# Patient Record
Sex: Female | Born: 1965
Health system: Southern US, Community
[De-identification: ages and names within clinical notes are randomized; demographics above are authoritative.]

## PROBLEM LIST (undated history)

## (undated) DIAGNOSIS — D649 Anemia, unspecified: Secondary | ICD-10-CM

## (undated) DIAGNOSIS — E229 Hyperfunction of pituitary gland, unspecified: Secondary | ICD-10-CM

## (undated) DIAGNOSIS — D352 Benign neoplasm of pituitary gland: Secondary | ICD-10-CM

## (undated) DIAGNOSIS — R7989 Other specified abnormal findings of blood chemistry: Secondary | ICD-10-CM

## (undated) HISTORY — DX: Anemia, unspecified: D64.9

## (undated) HISTORY — DX: Hyperfunction of pituitary gland, unspecified: E22.9

## (undated) HISTORY — PX: LASER ABLATION OF THE CERVIX: SHX1949

## (undated) HISTORY — DX: Benign neoplasm of pituitary gland: D35.2

## (undated) HISTORY — DX: Other specified abnormal findings of blood chemistry: R79.89

## (undated) HISTORY — PX: COLPOSCOPY: SHX161

---

## 1998-11-28 ENCOUNTER — Other Ambulatory Visit: Admission: RE | Admit: 1998-11-28 | Discharge: 1998-11-28 | Payer: Self-pay | Admitting: Gynecology

## 1999-12-02 ENCOUNTER — Other Ambulatory Visit: Admission: RE | Admit: 1999-12-02 | Discharge: 1999-12-02 | Payer: Self-pay | Admitting: Gynecology

## 2001-02-16 ENCOUNTER — Other Ambulatory Visit: Admission: RE | Admit: 2001-02-16 | Discharge: 2001-02-16 | Payer: Self-pay | Admitting: Gynecology

## 2002-03-14 ENCOUNTER — Other Ambulatory Visit: Admission: RE | Admit: 2002-03-14 | Discharge: 2002-03-14 | Payer: Self-pay | Admitting: Gynecology

## 2003-03-27 ENCOUNTER — Other Ambulatory Visit: Admission: RE | Admit: 2003-03-27 | Discharge: 2003-03-27 | Payer: Self-pay | Admitting: Gynecology

## 2004-07-01 ENCOUNTER — Other Ambulatory Visit: Admission: RE | Admit: 2004-07-01 | Discharge: 2004-07-01 | Payer: Self-pay | Admitting: Gynecology

## 2005-08-05 ENCOUNTER — Other Ambulatory Visit: Admission: RE | Admit: 2005-08-05 | Discharge: 2005-08-05 | Payer: Self-pay | Admitting: Gynecology

## 2006-08-10 ENCOUNTER — Other Ambulatory Visit: Admission: RE | Admit: 2006-08-10 | Discharge: 2006-08-10 | Payer: Self-pay | Admitting: Gynecology

## 2007-01-10 ENCOUNTER — Encounter: Admission: RE | Admit: 2007-01-10 | Discharge: 2007-01-10 | Payer: Self-pay | Admitting: Gynecology

## 2008-02-03 ENCOUNTER — Encounter: Admission: RE | Admit: 2008-02-03 | Discharge: 2008-02-03 | Payer: Self-pay | Admitting: Gynecology

## 2008-05-17 ENCOUNTER — Ambulatory Visit: Payer: Self-pay | Admitting: Women's Health

## 2008-05-17 ENCOUNTER — Other Ambulatory Visit: Admission: RE | Admit: 2008-05-17 | Discharge: 2008-05-17 | Payer: Self-pay | Admitting: Gynecology

## 2008-05-17 ENCOUNTER — Encounter: Payer: Self-pay | Admitting: Women's Health

## 2009-03-19 ENCOUNTER — Encounter: Admission: RE | Admit: 2009-03-19 | Discharge: 2009-03-19 | Payer: Self-pay | Admitting: Gynecology

## 2009-07-02 ENCOUNTER — Ambulatory Visit: Payer: Self-pay | Admitting: Gynecology

## 2009-07-03 ENCOUNTER — Ambulatory Visit: Payer: Self-pay | Admitting: Gynecology

## 2009-07-25 ENCOUNTER — Encounter: Admission: RE | Admit: 2009-07-25 | Discharge: 2009-07-25 | Payer: Self-pay | Admitting: Gastroenterology

## 2009-09-20 ENCOUNTER — Ambulatory Visit: Payer: Self-pay | Admitting: Gynecology

## 2010-02-05 ENCOUNTER — Other Ambulatory Visit: Admission: RE | Admit: 2010-02-05 | Discharge: 2010-02-05 | Payer: Self-pay | Admitting: Gynecology

## 2010-02-05 ENCOUNTER — Ambulatory Visit: Payer: Self-pay | Admitting: Women's Health

## 2010-02-17 ENCOUNTER — Ambulatory Visit: Payer: Self-pay | Admitting: Women's Health

## 2010-02-17 ENCOUNTER — Ambulatory Visit: Payer: Self-pay | Admitting: Gynecology

## 2010-02-24 ENCOUNTER — Ambulatory Visit: Payer: Self-pay | Admitting: Women's Health

## 2010-02-24 HISTORY — PX: ENDOMETRIAL ABLATION: SHX621

## 2010-03-12 ENCOUNTER — Ambulatory Visit (HOSPITAL_COMMUNITY): Admission: RE | Admit: 2010-03-12 | Discharge: 2010-03-12 | Payer: Self-pay | Admitting: Gynecology

## 2010-03-19 ENCOUNTER — Ambulatory Visit: Payer: Self-pay | Admitting: Gynecology

## 2010-03-24 ENCOUNTER — Ambulatory Visit: Payer: Self-pay | Admitting: Gynecology

## 2010-03-27 ENCOUNTER — Encounter: Admission: RE | Admit: 2010-03-27 | Discharge: 2010-03-27 | Payer: Self-pay | Admitting: Gynecology

## 2010-03-27 DIAGNOSIS — D352 Benign neoplasm of pituitary gland: Secondary | ICD-10-CM

## 2010-03-27 HISTORY — DX: Benign neoplasm of pituitary gland: D35.2

## 2010-04-07 ENCOUNTER — Ambulatory Visit: Payer: Self-pay | Admitting: Gynecology

## 2010-05-12 ENCOUNTER — Ambulatory Visit (HOSPITAL_COMMUNITY): Admission: RE | Admit: 2010-05-12 | Discharge: 2010-05-12 | Payer: Self-pay | Admitting: Gynecology

## 2010-05-26 ENCOUNTER — Encounter: Admission: RE | Admit: 2010-05-26 | Discharge: 2010-05-26 | Payer: Self-pay | Admitting: Gynecology

## 2010-08-16 ENCOUNTER — Encounter: Payer: Self-pay | Admitting: Gynecology

## 2010-08-18 ENCOUNTER — Encounter: Payer: Self-pay | Admitting: Gynecology

## 2010-09-12 ENCOUNTER — Emergency Department (HOSPITAL_BASED_OUTPATIENT_CLINIC_OR_DEPARTMENT_OTHER)
Admission: EM | Admit: 2010-09-12 | Discharge: 2010-09-12 | Disposition: A | Discharge status: Home / Self Care | Payer: 59 | Attending: Emergency Medicine | Admitting: Emergency Medicine

## 2010-09-12 ENCOUNTER — Emergency Department (INDEPENDENT_AMBULATORY_CARE_PROVIDER_SITE_OTHER): Payer: 59

## 2010-09-12 DIAGNOSIS — Y92009 Unspecified place in unspecified non-institutional (private) residence as the place of occurrence of the external cause: Secondary | ICD-10-CM | POA: Insufficient documentation

## 2010-09-12 DIAGNOSIS — S92919A Unspecified fracture of unspecified toe(s), initial encounter for closed fracture: Secondary | ICD-10-CM | POA: Insufficient documentation

## 2010-09-12 DIAGNOSIS — E109 Type 1 diabetes mellitus without complications: Secondary | ICD-10-CM | POA: Insufficient documentation

## 2010-09-12 DIAGNOSIS — S90129A Contusion of unspecified lesser toe(s) without damage to nail, initial encounter: Secondary | ICD-10-CM

## 2010-09-12 DIAGNOSIS — W208XXA Other cause of strike by thrown, projected or falling object, initial encounter: Secondary | ICD-10-CM | POA: Insufficient documentation

## 2010-10-10 LAB — CREATININE, SERUM
Creatinine, Ser: 0.71 mg/dL (ref 0.4–1.2)
GFR calc non Af Amer: >60 mL/min (ref >60)

## 2010-12-01 ENCOUNTER — Ambulatory Visit (INDEPENDENT_AMBULATORY_CARE_PROVIDER_SITE_OTHER): Payer: 59 | Admitting: Gynecology

## 2010-12-02 ENCOUNTER — Other Ambulatory Visit: Payer: Self-pay | Admitting: Gynecology

## 2010-12-02 DIAGNOSIS — N6320 Unspecified lump in the left breast, unspecified quadrant: Secondary | ICD-10-CM

## 2010-12-04 ENCOUNTER — Ambulatory Visit
Admission: RE | Admit: 2010-12-04 | Discharge: 2010-12-04 | Disposition: A | Discharge status: Home / Self Care | Payer: 59 | Source: Ambulatory Visit | Attending: Gynecology | Admitting: Gynecology

## 2010-12-04 ENCOUNTER — Other Ambulatory Visit: Payer: Self-pay | Admitting: Gynecology

## 2010-12-04 DIAGNOSIS — N6320 Unspecified lump in the left breast, unspecified quadrant: Secondary | ICD-10-CM

## 2010-12-09 ENCOUNTER — Ambulatory Visit: Payer: Self-pay | Admitting: Family Medicine

## 2010-12-29 ENCOUNTER — Ambulatory Visit: Payer: Self-pay | Admitting: Family Medicine

## 2011-01-26 ENCOUNTER — Encounter: Payer: Self-pay | Admitting: Family Medicine

## 2011-01-26 ENCOUNTER — Ambulatory Visit (INDEPENDENT_AMBULATORY_CARE_PROVIDER_SITE_OTHER): Payer: 59 | Admitting: Family Medicine

## 2011-01-26 DIAGNOSIS — M7710 Lateral epicondylitis, unspecified elbow: Secondary | ICD-10-CM

## 2011-01-26 DIAGNOSIS — E109 Type 1 diabetes mellitus without complications: Secondary | ICD-10-CM

## 2011-01-26 DIAGNOSIS — N6019 Diffuse cystic mastopathy of unspecified breast: Secondary | ICD-10-CM

## 2011-01-26 DIAGNOSIS — Z0000 Encounter for general adult medical examination without abnormal findings: Secondary | ICD-10-CM | POA: Insufficient documentation

## 2011-01-26 MED ORDER — INSULIN LISPRO 100 UNIT/ML ~~LOC~~ SOLN: SUBCUTANEOUS | Status: DC

## 2011-01-26 MED ORDER — INSULIN SYRINGE-NEEDLE U-100 30G X 1/2" 0.5 ML MISC: Status: DC

## 2011-01-26 MED ORDER — INSULIN GLARGINE 100 UNIT/ML ~~LOC~~ SOLN: 10.0000 [IU] | Freq: Two times a day (BID) | SUBCUTANEOUS | Status: DC

## 2011-01-26 MED ORDER — "INSULIN SYRINGE-NEEDLE U-100 30G X 1/2"" 0.5 ML MISC": Status: DC

## 2011-01-26 MED ORDER — GLUCOSE BLOOD VI STRP: ORAL_STRIP | Status: DC

## 2011-01-26 NOTE — Progress Notes (Signed)
  Subjective:    Patient ID: Maria White, female    DOB: May 02, 1966, 45 y.o.   MRN: [id number]  HPI New to establish.  Previous MD- Dagoberto Ligas.  GYN- Fontaine.  DM- type I, needs new Endo.  On insulin.  Labs last checked 6-7 months ago.  Has never had elevated cholesterol.  UTD on eye exams.  BP excellent.  No symptomatic lows, no CP, SOB, HAs, visual changes, N/V.  Fibrocystic breasts- has had multiple mammogram call backs that required [place].  Follows regularly.  R tennis elbow- has seen Dr Samuel Bouche at [facility] ortho and is currently in PT.  On [place] prn.  Reports sxs are getting better.  sxs first started 3 months ago after doing heavy lifting.   Review of Systems For ROS see HPI     Objective:   Physical Exam  Constitutional: She is oriented to person, place, and time. She appears well-developed and well-nourished. No distress.  HENT:  Head: Normocephalic and atraumatic.  Eyes: Conjunctivae and EOM are normal. Pupils are equal, round, and reactive to light.  Neck: Normal range of motion. Neck supple. No thyromegaly present.  Cardiovascular: Normal rate, regular rhythm, normal heart sounds and intact distal pulses.   No murmur heard. Pulmonary/Chest: Effort normal and breath sounds normal. No respiratory distress.  Abdominal: Soft. She exhibits no distension. There is no tenderness.  Musculoskeletal: Normal range of motion. She exhibits tenderness (minimal tenderness over R lateral epicondyle). She exhibits no edema.  Lymphadenopathy:    She has no cervical adenopathy.  Neurological: She is alert and oriented to person, place, and time.  Skin: Skin is warm and dry.  Psychiatric: She has a normal mood and affect. Her behavior is normal.          Assessment & Plan:

## 2011-01-26 NOTE — Patient Instructions (Signed)
Please schedule your complete physical at your convenience- get an 8 am appt but arrive ~7:45 for labs [facility] call you with your Endo appt Call with any questions or concerns Welcome!  We're glad to have you!

## 2011-02-03 DIAGNOSIS — M7710 Lateral epicondylitis, unspecified elbow: Secondary | ICD-10-CM | POA: Insufficient documentation

## 2011-02-03 DIAGNOSIS — N6019 Diffuse cystic mastopathy of unspecified breast: Secondary | ICD-10-CM | POA: Insufficient documentation

## 2011-02-03 NOTE — Assessment & Plan Note (Signed)
Labs ordered for upcoming CPE- pt to have labs prior to 8am appt due to insulin dependent DM

## 2011-02-03 NOTE — Assessment & Plan Note (Addendum)
Refills provided on pt's meds.  Referral to endo made.

## 2011-02-03 NOTE — Assessment & Plan Note (Signed)
Pt UTD on mammos.  Will follow along w/ clinical exams.

## 2011-02-03 NOTE — Assessment & Plan Note (Signed)
Following w/ ortho.  Currently in PT.  Feels sxs are improving.  Will follow.

## 2011-02-17 ENCOUNTER — Telehealth: Payer: Self-pay | Admitting: *Deleted

## 2011-02-17 MED ORDER — GLUCOSE BLOOD VI STRP: ORAL_STRIP | Status: AC

## 2011-02-17 NOTE — Telephone Encounter (Signed)
Pt aware Rx call in to pharmacy.

## 2011-02-17 NOTE — Telephone Encounter (Signed)
If she is testing that often she can have an increased # of strips.  She is also seeing Endo for her DM.

## 2011-02-17 NOTE — Telephone Encounter (Signed)
Pt states that she test her BS from 10 to 15 times a day so she needs at least 7 boxes to cover a 3 month supply. Please advise

## 2011-02-27 ENCOUNTER — Other Ambulatory Visit: Payer: Self-pay | Admitting: Internal Medicine

## 2011-02-27 DIAGNOSIS — D353 Benign neoplasm of craniopharyngeal duct: Secondary | ICD-10-CM

## 2011-03-06 ENCOUNTER — Telehealth: Payer: Self-pay | Admitting: Family Medicine

## 2011-03-06 NOTE — Telephone Encounter (Signed)
Pt husband aware

## 2011-03-06 NOTE — Telephone Encounter (Signed)
yes

## 2011-03-19 ENCOUNTER — Encounter: Payer: Self-pay | Admitting: Family Medicine

## 2011-03-31 ENCOUNTER — Other Ambulatory Visit: Payer: 59

## 2011-04-02 ENCOUNTER — Encounter: Payer: Self-pay | Admitting: Family Medicine

## 2011-04-02 ENCOUNTER — Ambulatory Visit (INDEPENDENT_AMBULATORY_CARE_PROVIDER_SITE_OTHER): Payer: 59 | Admitting: Family Medicine

## 2011-04-02 VITALS — BP 100/70 | HR 80 | Temp 99.5°F | Wt 130.4 lb

## 2011-04-02 DIAGNOSIS — J029 Acute pharyngitis, unspecified: Secondary | ICD-10-CM

## 2011-04-02 NOTE — Patient Instructions (Signed)
Common Cold, Adult An upper respiratory tract infection, or cold, is a viral infection of the air passages to the lung. Colds are contagious, especially during the first 3 or 4 days. Antibiotics cannot cure a cold. Cold germs are spread by coughs, sneezes, and hand to hand contact. A respiratory tract infection usually clears up in a few days, but some people may be sick for a week or two. HOME CARE INSTRUCTIONS  Only take over-the-counter or prescription medicines for pain, discomfort, or fever as directed by your caregiver.   Be careful not to blow your nose too hard. This may cause a nosebleed.   Use a cool-mist humidifier (vaporizer) to increase air moisture. This will make it easier for you to breath. Do not use hot steam.   Rest as much as possible and get plenty of sleep.   Wash your hands often, especially after you blow your nose. Cover your mouth and nose with a tissue when you sneeze or cough.   Drink at least 8 glasses of clear liquids every day, such as water, fruit juices, tea, clear soups, and carbonated beverages.  SEEK MEDICAL CARE IF:  An oral temperature above 100.4 lasts 4 days or more, and is not controlled by medication.   You have a sore throat that gets worse or you see white or yellow spots in your throat.   Your cough gets worse or lasts more than 10 days.   You have a rash somewhere on your skin. You have large and tender lumps in your neck.   You have an earache or a headache.   You have thick, greenish or yellowish discharge from your nose.   You cough-up thick yellow, green, gray or bloody mucus (secretions).  SEEK IMMEDIATE MEDICAL CARE IF: You have trouble breathing, chest pain, or your skin or nails look gray or blue. MAKE SURE YOU:   Understand these instructions.   Will watch your condition.   Will get help right away if you are not doing well or get worse.  Document Released: 07/10/2000 Document Re-Released: 06/25/2008 [employer] Patient  Information 2011 [employer]. 

## 2011-04-02 NOTE — Progress Notes (Signed)
  Subjective:     Maria White is a 45 y.o. female who presents for evaluation of sore throat. Associated symptoms include post nasal drip, sinus and nasal congestion, sore throat and swollen glands. Onset of symptoms was 6 days ago, and have been gradually worsening since that time. She is drinking plenty of fluids. She has not had a recent close exposure to someone with proven streptococcal pharyngitis.  The following portions of the patient's history were reviewed and updated as appropriate: allergies, current medications, past family history, past medical history, past social history, past surgical history and problem list.  Review of Systems Pertinent items are noted in HPI.    Objective:    BP 100/70  Pulse 80  Temp(Src) 99.5 F (37.5 C) (Oral)  Wt 130 lb 6.4 oz (59.149 kg)  SpO2 97% General appearance: alert, cooperative, appears stated age and no distress Head: Normocephalic, without obvious abnormality, atraumatic Ears: normal TM's and external ear canals both ears Nose: Nares normal. Septum midline. Mucosa normal. No drainage or sinus tenderness. Throat: abnormal findings: mild oropharyngeal erythema and pnd Neck: no adenopathy, no carotid bruit, no JVD, supple, symmetrical, trachea midline and thyroid not enlarged, symmetric, no tenderness/mass/nodules Lungs: clear to auscultation bilaterally Heart: S1, S2 normal Skin: Skin color, texture, turgor normal. No rashes or lesions  Laboratory Strep test not done. Results:n/a.    Assessment:    Acute pharyngitis, likely  Viral pharyngitis.    Plan:    Use of OTC analgesics recommended as well as salt water gargles. Follow up as needed. nasonex, otc antihistamine

## 2011-04-03 LAB — CBC WITH DIFFERENTIAL/PLATELET
Eosinophils Absolute: 0.1 10*3/uL (ref 0.0–0.7)
Eosinophils Relative: 1.3 % (ref 0.0–5.0)
HCT: 37.1 % (ref 36.0–46.0)
Lymphs Abs: 1.8 10*3/uL (ref 0.7–4.0)
MCHC: 33.4 g/dL (ref 30.0–36.0)
MCV: 88.1 fl (ref 78.0–100.0)
Monocytes Absolute: 0.5 10*3/uL (ref 0.1–1.0)
Platelets: 184 10*3/uL (ref 150.0–400.0)
WBC: 4.2 10*3/uL — ABNORMAL LOW (ref 4.5–10.5)

## 2011-04-03 LAB — MONONUCLEOSIS SCREEN: Mono Screen: NEGATIVE

## 2011-04-08 ENCOUNTER — Encounter: Payer: 59 | Admitting: Women's Health

## 2011-04-15 ENCOUNTER — Encounter: Payer: Self-pay | Admitting: Women's Health

## 2011-04-15 ENCOUNTER — Other Ambulatory Visit (HOSPITAL_COMMUNITY)
Admission: RE | Admit: 2011-04-15 | Discharge: 2011-04-15 | Disposition: A | Discharge status: Home / Self Care | Payer: 59 | Source: Ambulatory Visit | Attending: Gynecology | Admitting: Gynecology

## 2011-04-15 ENCOUNTER — Ambulatory Visit (INDEPENDENT_AMBULATORY_CARE_PROVIDER_SITE_OTHER): Payer: 59 | Admitting: Women's Health

## 2011-04-15 VITALS — BP 116/74 | Ht 64.5 in | Wt 130.0 lb

## 2011-04-15 DIAGNOSIS — Z01419 Encounter for gynecological examination (general) (routine) without abnormal findings: Secondary | ICD-10-CM | POA: Insufficient documentation

## 2011-04-15 NOTE — Progress Notes (Signed)
Maria White 1966/03/05 [id number]    History:    The patient presents for annual exam.  Type I diabetic on insulin, stay-at-home mom, has 2 daughters Reuel Boom. Encouraged Gardasil for both daughters.   Past medical history, past surgical history, family history and social history were all reviewed and documented in the EPIC chart.   ROS:  A  ROS was performed and pertinent positives and negatives are included in the history.  Exam:  Filed Vitals:   04/15/11 0935  BP: 116/74    General appearance:  Normal Head/Neck:  Normal, without cervical or supraclavicular adenopathy. Thyroid:  Symmetrical, normal in size, without palpable masses or nodularity. Respiratory  Effort:  Normal  Auscultation:  Clear without wheezing or rhonchi Cardiovascular  Auscultation:  Regular rate, without rubs, murmurs or gallops  Edema/varicosities:  Not grossly evident Abdominal  Soft,nontender, without masses, guarding or rebound.  Liver/spleen:  No organomegaly noted  Hernia:  None appreciated  Skin  Inspection:  Grossly normal  Palpation:  Grossly normal Neurologic/psychiatric  Orientation:  Normal with appropriate conversation.  Mood/affect:  Normal  Genitourinary    Breasts: Examined lying and sitting.     Right: Without masses, retractions, discharge or axillary adenopathy.     Left: Without masses, retractions, discharge or axillary adenopathy.   Inguinal/mons:  Normal without inguinal adenopathy  External genitalia:  Normal  BUS/Urethra/Skene's glands:  Normal  Bladder:  Normal  Vagina:  Normal  Cervix:  Normal  Uterus:  retroverted, normal in size, shape and contour.  Midline and mobile  Adnexa/parametria:     Rt: Without masses or tenderness.   Lt: Without masses or tenderness.  Anus and perineum: Normal  Digital rectal exam: Normal sphincter tone without palpated masses or tenderness  Assessment/Plan:  44 y.o.MWF G3P2  for annual exam without complaint. Monthly 4-8  days light cycle/vasectomy . Her option August of 2011, has helped her cycles to become light. History of a 5 mm pituitary adenoma September 2011. Had a prolactin of 25 at her primary care last month, did review importance of annual levels, and MRI every few years to check for stability if continues symptom free.  Normal GYN exam  Plan: Continue SBEs, yearly mammogram, calcium rich diet, exercise. Type I diabetic on insulin since age 89 needs referral to Dr. Talmage Nap will get that done, Dr Dagoberto Ligas retired. Labs are done at primary care. Continue annual flu vaccine, Tdap vaccine reviewed, has had a pneumonia vaccine.   Harrington Challenger [facility], 10:11 AM 04/15/2011

## 2011-05-05 ENCOUNTER — Telehealth: Payer: Self-pay | Admitting: *Deleted

## 2011-05-05 NOTE — Telephone Encounter (Signed)
Message     Needs referral to Dr Talmage Nap endocrinologist, Type 1 diabetic, Dr Dagoberto Ligas in past           Called patient about referral to Dr. Talmage Nap.  Records had been sent.  But patient had recently seen endocrinologist so she could not be seen by Dr. Talmage Nap just yet.  She would have to wait about 6 months per insurance plan.  She said they will hold her records and call her when time to schedule.

## 2011-05-16 ENCOUNTER — Other Ambulatory Visit: Payer: Self-pay | Admitting: Family Medicine

## 2011-05-18 ENCOUNTER — Other Ambulatory Visit: Payer: Self-pay | Admitting: *Deleted

## 2011-05-18 MED ORDER — INSULIN GLARGINE 100 UNIT/ML ~~LOC~~ SOLN: 10.0000 [IU] | Freq: Two times a day (BID) | SUBCUTANEOUS | Status: DC

## 2011-05-27 ENCOUNTER — Other Ambulatory Visit: Payer: Self-pay | Admitting: Gynecology

## 2011-05-27 ENCOUNTER — Other Ambulatory Visit: Payer: Self-pay | Admitting: Obstetrics and Gynecology

## 2011-05-27 DIAGNOSIS — Z1231 Encounter for screening mammogram for malignant neoplasm of breast: Secondary | ICD-10-CM

## 2011-06-04 ENCOUNTER — Ambulatory Visit
Admission: RE | Admit: 2011-06-04 | Discharge: 2011-06-04 | Disposition: A | Discharge status: Home / Self Care | Payer: 59 | Source: Ambulatory Visit | Attending: Gynecology | Admitting: Gynecology

## 2011-06-04 DIAGNOSIS — Z1231 Encounter for screening mammogram for malignant neoplasm of breast: Secondary | ICD-10-CM

## 2011-10-08 ENCOUNTER — Telehealth: Payer: Self-pay | Admitting: *Deleted

## 2011-10-08 NOTE — Telephone Encounter (Signed)
Pt called requesting lab faxed to dr. Talmage Nap office, office notes sent per request.

## 2011-10-26 ENCOUNTER — Other Ambulatory Visit: Payer: Self-pay | Admitting: Family Medicine

## 2011-10-26 NOTE — Telephone Encounter (Signed)
Patient states she needs a prescription sent to [facility] on [address] [place] for  Humalog She is currently waiting for new insurance to kick in & can only afford 1-vile at this time. Patient ph# [phone]

## 2011-10-27 ENCOUNTER — Telehealth: Payer: Self-pay | Admitting: *Deleted

## 2011-10-27 MED ORDER — INSULIN LISPRO 100 UNIT/ML ~~LOC~~ SOLN: SUBCUTANEOUS | Status: DC

## 2011-10-27 NOTE — Telephone Encounter (Signed)
Pt called requesting refill on Humalog insulin, pt informed nancy is off today and she did not  write Rx so pt will need to get rx from PCP.

## 2011-10-27 NOTE — Telephone Encounter (Signed)
rx sent to pharmacy by e-script for one vial Letter has been mailed to pt address noted in the chart to advise they are overdue for cpe/ov/labs and the pt needs to contact office to set up appt

## 2011-10-27 NOTE — Telephone Encounter (Signed)
Patient called again regarding her insulin. She states she is almost out and would like 1 vial called in ASAP.

## 2011-12-29 DIAGNOSIS — E119 Type 2 diabetes mellitus without complications: Secondary | ICD-10-CM | POA: Insufficient documentation

## 2012-06-20 ENCOUNTER — Other Ambulatory Visit: Payer: Self-pay | Admitting: Women's Health

## 2012-06-20 DIAGNOSIS — Z1231 Encounter for screening mammogram for malignant neoplasm of breast: Secondary | ICD-10-CM

## 2012-08-04 ENCOUNTER — Ambulatory Visit
Admission: RE | Admit: 2012-08-04 | Discharge: 2012-08-04 | Disposition: A | Discharge status: Home / Self Care | Payer: 59 | Source: Ambulatory Visit | Attending: Women's Health | Admitting: Women's Health

## 2012-08-04 DIAGNOSIS — Z1231 Encounter for screening mammogram for malignant neoplasm of breast: Secondary | ICD-10-CM

## 2013-04-26 ENCOUNTER — Other Ambulatory Visit (HOSPITAL_COMMUNITY)
Admission: RE | Admit: 2013-04-26 | Discharge: 2013-04-26 | Disposition: A | Discharge status: Home / Self Care | Payer: 59 | Source: Ambulatory Visit | Attending: Gynecology | Admitting: Gynecology

## 2013-04-26 ENCOUNTER — Ambulatory Visit (INDEPENDENT_AMBULATORY_CARE_PROVIDER_SITE_OTHER): Payer: 59 | Admitting: Women's Health

## 2013-04-26 ENCOUNTER — Encounter: Payer: Self-pay | Admitting: Women's Health

## 2013-04-26 VITALS — BP 110/70 | Ht 64.0 in | Wt 136.0 lb

## 2013-04-26 DIAGNOSIS — Z01419 Encounter for gynecological examination (general) (routine) without abnormal findings: Secondary | ICD-10-CM | POA: Insufficient documentation

## 2013-04-26 DIAGNOSIS — D352 Benign neoplasm of pituitary gland: Secondary | ICD-10-CM

## 2013-04-26 LAB — URINALYSIS W MICROSCOPIC + REFLEX CULTURE
Casts: NONE SEEN
Crystals: NONE SEEN
Glucose, UA: 100 mg/dL — AB
Leukocytes, UA: NEGATIVE
Squamous Epithelial / LPF: NONE SEEN
pH: 6 (ref 5.0–8.0)

## 2013-04-26 NOTE — Patient Instructions (Addendum)

## 2013-04-26 NOTE — Progress Notes (Addendum)
Maria White [birth year] [id number]    History:    The patient presents for annual exam.  Light monthly cycle/vasectomy. Her option 2011 with good relief of menorrhagia. History of laser treatment with normal Paps after. Type I diabetic diagnosed at age 47. Dr. Talmage Nap manages. Normal mammograms. History of pituitary adenoma 2011 with normal prolactins after. Has difficulty with MRIs due to diabetes and needs to sedated.   Past medical history, past surgical history, family history and social history were all reviewed and documented in the EPIC chart. Stay-at-home mom. Irving Burton 17 at [facility], Trinna Post 14 doing well. Maternal grandmother, maternal great-grandmother breast cancer. Mother hypertension.   ROS:  A  ROS was performed and pertinent positives and negatives are included in the history.  Exam:  Filed Vitals:   04/26/13 0954  BP: 110/70    General appearance:  Normal Head/Neck:  Normal, without cervical or supraclavicular adenopathy. Thyroid:  Symmetrical, normal in size, without palpable masses or nodularity. Respiratory  Effort:  Normal  Auscultation:  Clear without wheezing or rhonchi Cardiovascular  Auscultation:  Regular rate, without rubs, murmurs or gallops  Edema/varicosities:  Not grossly evident Abdominal  Soft,nontender, without masses, guarding or rebound.  Liver/spleen:  No organomegaly noted  Hernia:  None appreciated  Skin  Inspection:  Grossly normal  Palpation:  Grossly normal Neurologic/psychiatric  Orientation:  Normal with appropriate conversation.  Mood/affect:  Normal  Genitourinary    Breasts: Examined lying and sitting.     Right: Without masses, retractions, discharge or axillary adenopathy.     Left: Without masses, retractions, discharge or axillary adenopathy.   Inguinal/mons:  Normal without inguinal adenopathy  External genitalia:  Normal  BUS/Urethra/Skene's glands:  Normal  Bladder:  Normal  Vagina:  Normal  Cervix:  Normal  Uterus:    normal in size, shape and contour.  Midline and mobile  Adnexa/parametria:     Rt: Without masses or tenderness.   Lt: Without masses or tenderness.  Anus and perineum: Normal  Digital rectal exam: Normal sphincter tone without palpated masses or tenderness  Assessment/Plan:  47 y.o. M. WF G2 P2 for annual exam with no complaints.  Type 1 diabetes  Her option 2011 good relief of menorrhagia/light cycles/vasectomy Laser treatment of cervix 1994 with normal Paps after History of pituitary adenoma 2011  Plan: Prolactin, UA, Pap. Pap normal 2012, new screening guidelines reviewed. SBE's, continue annual mammogram, 3G tomography reviewed and encouraged history of dense breast. Continue regular exercise and good self-care, followup with Dr. Talmage Nap for labs. Continue annual prolactins.    Harrington Challenger WHNP, 11:05 AM 04/26/2013

## 2013-05-24 ENCOUNTER — Telehealth: Payer: Self-pay | Admitting: Women's Health

## 2013-05-24 NOTE — Telephone Encounter (Signed)
Telephone call to review prolactin. Prolactin low. History of an elevated prolactin with a MRI 2011 that was suspicious for 5 mm microadenoma. Had been on Dostinex in the past for elevated prolactins. Type I diabetic on insulin. Difficult to be n.p.o. for MRI and also needs to be sedated for MRI. After review with Dr. Audie Box will continue annual prolactin, reviewed importance of coming annually for physical and prolactin checked. The prolactin elevated will repeat MRI done.

## 2013-05-29 ENCOUNTER — Encounter: Payer: Self-pay | Admitting: Gynecology

## 2013-07-05 ENCOUNTER — Ambulatory Visit (INDEPENDENT_AMBULATORY_CARE_PROVIDER_SITE_OTHER): Payer: 59 | Admitting: Family Medicine

## 2013-07-05 ENCOUNTER — Encounter: Payer: Self-pay | Admitting: Family Medicine

## 2013-07-05 VITALS — BP 110/70 | HR 85 | Temp 98.2°F | Ht 64.25 in | Wt 138.2 lb

## 2013-07-05 DIAGNOSIS — R05 Cough: Secondary | ICD-10-CM

## 2013-07-05 MED ORDER — IPRATROPIUM-ALBUTEROL 0.5-2.5 (3) MG/3ML IN SOLN
3.0000 mL | Freq: Once | RESPIRATORY_TRACT | Status: AC
  Administered 2013-07-05: 3 mL via RESPIRATORY_TRACT

## 2013-07-05 NOTE — Progress Notes (Signed)
Pre visit review using our clinic review tool, if applicable. No additional management support is needed unless otherwise documented below in the visit note. 

## 2013-07-05 NOTE — Patient Instructions (Signed)
Follow up as needed Start the Qvar- 1 puff twice daily until feeling better Use the Proair- 2 puffs every 4-6 hrs as a rescue inhaler for coughing fits Drink plenty of fluids Call with any questions or concerns Hang in there!!

## 2013-07-05 NOTE — Assessment & Plan Note (Signed)
New.  No evidence of infxn on PE.  Pt's cough improved s/p neb tx.  Due to pt's DM will not start oral steroids.  Start inhaled steroids to improve airway inflammation.  Albuterol as needed for coughing fits.  Reviewed supportive care and red flags that should prompt return.  Pt expressed understanding and is in agreement w/ plan.

## 2013-07-05 NOTE — Progress Notes (Signed)
   Subjective:    Patient ID: Maria White, female    DOB: 01-27-1966, 47 y.o.   MRN: [id number]  HPI Cough- seen at [facility] early November after being sick x1 week.  Got script for Augmentin.  Saw Endo 4 days later and was switched to Zpack.  Was given tessalon w/out relief.  Has taken OTC cough and cold meds.  Using Robitussin Night time cough to allow sleep.  No fever recently.  'i feel ok except i'm tired'.  Cough is productive.  No sore throat.  Now chest discomfort from the cough.   Review of Systems For ROS see HPI     Objective:   Physical Exam  Vitals reviewed. Constitutional: She appears well-developed and well-nourished. No distress.  HENT:  Head: Normocephalic and atraumatic.  TMs normal bilaterally Mild nasal congestion Throat w/out erythema, edema, or exudate  Eyes: Conjunctivae and EOM are normal. Pupils are equal, round, and reactive to light.  Neck: Normal range of motion. Neck supple.  Cardiovascular: Normal rate, regular rhythm, normal heart sounds and intact distal pulses.   No murmur heard. Pulmonary/Chest: Effort normal and breath sounds normal. No respiratory distress. She has no wheezes.  + hacking cough Decreased air movement- improved s/p neb tx  Lymphadenopathy:    She has no cervical adenopathy.          Assessment & Plan:

## 2013-10-30 ENCOUNTER — Ambulatory Visit (INDEPENDENT_AMBULATORY_CARE_PROVIDER_SITE_OTHER): Payer: 59 | Admitting: Family

## 2013-10-30 ENCOUNTER — Encounter: Payer: Self-pay | Admitting: Family

## 2013-10-30 VITALS — BP 102/68 | HR 80 | Temp 98.9°F | Ht 62.0 in | Wt 138.1 lb

## 2013-10-30 DIAGNOSIS — J329 Chronic sinusitis, unspecified: Secondary | ICD-10-CM

## 2013-10-30 MED ORDER — AMOXICILLIN 500 MG PO CAPS: 500.0000 mg | ORAL_CAPSULE | Freq: Three times a day (TID) | ORAL | Status: DC

## 2013-10-30 NOTE — Patient Instructions (Signed)

## 2013-10-30 NOTE — Progress Notes (Signed)
Pre visit review using our clinic review tool, if applicable. No additional management support is needed unless otherwise documented below in the visit note. 

## 2013-10-30 NOTE — Progress Notes (Signed)
Subjective:    Patient ID: Maria White, female    DOB: [birth year], 48 y.o.   MRN: [id number]  HPI  Maria White is a 48 yr old female who presents today with chief complaint of nasal congestion.  Patient reports that symptoms started as a dry/hot sore throat. Then developed nasal congestion/post nasal drip. Has pressure in the cheeks.  She has taken otc flonase, allegra, mucinex-D advil congestion/sinus, benadryl without improvement. Symptoms started 3/21.  Has some associated clear drainage from the left ear. + cough, productive at times.     Review of Systems See HPI  Past Medical History  Diagnosis Date  . Diabetes mellitus     age 57  . Anemia   . Pituitary adenoma 9/11    5 mm  . Elevated prolactin level     History   Social History  . Marital Status: Married    Spouse Name: N/A    Number of Children: N/A  . Years of Education: N/A   Occupational History  . Not on file.   Social History Main Topics  . Smoking status: Never Smoker   . Smokeless tobacco: Not on file  . Alcohol Use: No  . Drug Use: No  . Sexual Activity: Yes    Birth Control/ Protection: Other-see comments     Comment: Vasectomy   Other Topics Concern  . Not on file   Social History Narrative  . No narrative on file    Past Surgical History  Procedure Laterality Date  . Endometrial ablation  8/11    her option  . Laser ablation of the cervix    . Colposcopy      Family History  Problem Relation Age of Onset  . Breast cancer Maternal Grandmother 81    and great grandmother  . Hypertension Mother     No Known Allergies  Current Outpatient Prescriptions on File Prior to Visit  Medication Sig Dispense Refill  . glucose blood (ONE TOUCH ULTRA TEST) test strip Use as instructed test 8 to 10 times a day  700 each  3  . insulin glargine (LANTUS) 100 UNIT/ML injection Inject 10 Units into the skin 2 (two) times daily.  30 mL  3  . insulin lispro (HUMALOG) 100 UNIT/ML injection  Sliding scale  3 mL  3  . Insulin Syringe-Needle U-100 (B-D INS SYR ULTRAFINE .5CC/30G) 30G X 1/2" 0.5 ML MISC Use as directed to administer insulin  600 each  3  . Multiple Vitamin (MULTIVITAMIN) tablet Take 1 tablet by mouth daily.       No current facility-administered medications on file prior to visit.    BP 102/68  Pulse 80  Temp(Src) 98.9 F (37.2 C) (Oral)  Ht 5\' 2"  (1.575 m)  Wt 138 lb 1.3 oz (62.633 kg)  BMI 25.25 kg/m2  SpO2 99%  LMP 10/16/2013       Objective:   Physical Exam  Constitutional: She is oriented to person, place, and time. She appears well-developed and well-nourished. No distress.  HENT:  Head: Normocephalic and atraumatic.  Right Ear: Tympanic membrane and ear canal normal.  Left Ear: Tympanic membrane and ear canal normal.  Mouth/Throat: No oropharyngeal exudate, posterior oropharyngeal edema, posterior oropharyngeal erythema or tonsillar abscesses.  Bilateral maxillary sinus tenderness to palpation Bilateral frontal sinus tenderness to palpation R>L.  Cardiovascular: Normal rate and regular rhythm.   No murmur heard. Pulmonary/Chest: Effort normal and breath sounds normal. No respiratory distress. She has no wheezes. She  has no rales. She exhibits no tenderness.  Neurological: She is alert and oriented to person, place, and time.  Skin: Skin is warm and dry.  Psychiatric: She has a normal mood and affect. Her behavior is normal. Judgment and thought content normal.          Assessment & Plan:

## 2013-10-30 NOTE — Assessment & Plan Note (Signed)
Discussed continue antihistamine with decongestant for 1 week such as claritin D, then trying to transition to plain claritin. Continue nasal steroid such as OTC flonase, start amoxicillin, follow up if symptoms worsen, or if symptoms do not improve.

## 2013-12-22 ENCOUNTER — Other Ambulatory Visit: Payer: Self-pay

## 2013-12-22 DIAGNOSIS — Z1231 Encounter for screening mammogram for malignant neoplasm of breast: Secondary | ICD-10-CM

## 2014-01-05 ENCOUNTER — Ambulatory Visit
Admission: RE | Admit: 2014-01-05 | Discharge: 2014-01-05 | Disposition: A | Discharge status: Home / Self Care | Payer: 59 | Source: Ambulatory Visit

## 2014-01-05 ENCOUNTER — Encounter (INDEPENDENT_AMBULATORY_CARE_PROVIDER_SITE_OTHER): Payer: Self-pay

## 2014-01-05 DIAGNOSIS — Z1231 Encounter for screening mammogram for malignant neoplasm of breast: Secondary | ICD-10-CM

## 2014-02-13 ENCOUNTER — Ambulatory Visit (INDEPENDENT_AMBULATORY_CARE_PROVIDER_SITE_OTHER): Payer: 59 | Admitting: Women's Health

## 2014-02-13 DIAGNOSIS — N6452 Nipple discharge: Secondary | ICD-10-CM

## 2014-02-13 DIAGNOSIS — N6459 Other signs and symptoms in breast: Secondary | ICD-10-CM

## 2014-02-13 NOTE — Progress Notes (Signed)
Patient ID: Maria White, female   DOB: Sep 05, 1965, 48 y.o.   MRN: [id number] Presents with complaints of H/A and Nipple discharge.  Reports right sided headaches that have increased over the past six months, but now having most days.  States  pain comes and goes, constant and dull over  right eye.  Denies pain radiation, nausea or vomiting or visual changes. Reports trying OTC for pain relief, but no relief. Reports left side white nipple discharge spontaneously, twice. History of pituitary tumor,  2011  MRI showed questionable 5 mm microadenoma.  Type I diabetic in good control.  Exam: Appears well. Breast are symmetrel, no discharge noted. nontender . No skin dimpling or erythema noted. No nipple inversion or nipple retractation.  Persistent right-sided headache Left nipple discharge   Plan : prolactin level- pending results. If normal refer to Endocrinologist for follow-up. Education provided for cluster headache, hydration

## 2014-02-13 NOTE — Patient Instructions (Signed)

## 2014-02-14 LAB — PROLACTIN: Prolactin: 18.9 ng/mL

## 2014-02-19 ENCOUNTER — Telehealth: Payer: Self-pay | Admitting: *Deleted

## 2014-02-19 NOTE — Telephone Encounter (Signed)
Message copied by Thamas Jaegers on Mon Feb 19, 2014  9:01 AM ------      Message from: Ramond Craver      Created: Wed Feb 14, 2014 12:12 PM      Regarding: neurology referral       Per [place] "Please call and review prolactin continues to be in the normal range, reviewed with Dr. Phineas Real, best to refer to neurologist to evaluate headaches. She is a type I diabetic. History of elevated prolactin, questionable 5 mm microadenoma 2011. She did not tolerate MRI  Well/needs to be sedated. Please schedule appointment with Dr. Catalina Gravel to evaluate headaches"            Patient knows it will be next week before she hears anything.       Thanks!! ------

## 2014-02-19 NOTE — Telephone Encounter (Signed)
Appointment on 03/05/14 @ 3:10 pm with Dr.Lewit, notes faxed, pt informed.

## 2014-03-12 ENCOUNTER — Other Ambulatory Visit: Payer: Self-pay | Admitting: Neurology

## 2014-03-12 DIAGNOSIS — R519 Headache, unspecified: Secondary | ICD-10-CM

## 2014-03-12 DIAGNOSIS — Z87898 Personal history of other specified conditions: Secondary | ICD-10-CM

## 2014-03-12 DIAGNOSIS — R7989 Other specified abnormal findings of blood chemistry: Secondary | ICD-10-CM

## 2014-03-12 DIAGNOSIS — R51 Headache: Secondary | ICD-10-CM

## 2014-03-12 DIAGNOSIS — E229 Hyperfunction of pituitary gland, unspecified: Principal | ICD-10-CM

## 2014-03-18 ENCOUNTER — Ambulatory Visit
Admission: RE | Admit: 2014-03-18 | Discharge: 2014-03-18 | Disposition: A | Discharge status: Home / Self Care | Payer: 59 | Source: Ambulatory Visit | Attending: Neurology | Admitting: Neurology

## 2014-03-18 DIAGNOSIS — R519 Headache, unspecified: Secondary | ICD-10-CM

## 2014-03-18 DIAGNOSIS — R7989 Other specified abnormal findings of blood chemistry: Secondary | ICD-10-CM

## 2014-03-18 DIAGNOSIS — E229 Hyperfunction of pituitary gland, unspecified: Principal | ICD-10-CM

## 2014-03-18 DIAGNOSIS — Z87898 Personal history of other specified conditions: Secondary | ICD-10-CM

## 2014-03-18 DIAGNOSIS — R51 Headache: Secondary | ICD-10-CM

## 2014-03-18 MED ORDER — GADOBENATE DIMEGLUMINE 529 MG/ML IV SOLN
8.0000 mL | Freq: Once | INTRAVENOUS | Status: AC | PRN
  Administered 2014-03-18: 8 mL via INTRAVENOUS

## 2014-05-01 ENCOUNTER — Encounter: Payer: Self-pay | Admitting: Women's Health

## 2014-05-01 ENCOUNTER — Ambulatory Visit (INDEPENDENT_AMBULATORY_CARE_PROVIDER_SITE_OTHER): Payer: 59 | Admitting: Women's Health

## 2014-05-01 VITALS — BP 110/78 | Ht 64.0 in | Wt 141.0 lb

## 2014-05-01 DIAGNOSIS — Z01419 Encounter for gynecological examination (general) (routine) without abnormal findings: Secondary | ICD-10-CM

## 2014-05-01 NOTE — Progress Notes (Signed)
Maria White 11-26-1965 [id number]    History:    Presents for annual exam.  No or light cycle/vasectomy/her option 2011. Minimal menopausal symptoms. Type I diabetic age 48 Dr. Chalmers Cater manages. History of a pituitary adenoma 2007 stable 4-5 mm on MRI 2015, on no medication. Normal prolactin 2015. 1994 laser cervix was normal Paps after. Normal mammograms history.  Past medical history, past surgical history, family history and social history were all reviewed and documented in the EPIC chart. Homemaker. Maria White 18 at [facility] doing well, Maria White 15. Mother hypertension. Maternal grandmother, maternal great-grandmother breast cancer.  ROS:  A  12 point ROS was performed and pertinent positives and negatives are included.  Exam:  Filed Vitals:   05/01/14 1001  BP: 110/78    General appearance:  Normal Thyroid:  Symmetrical, normal in size, without palpable masses or nodularity. Respiratory  Auscultation:  Clear without wheezing or rhonchi Cardiovascular  Auscultation:  Regular rate, without rubs, murmurs or gallops  Edema/varicosities:  Not grossly evident Abdominal  Soft,nontender, without masses, guarding or rebound.  Liver/spleen:  No organomegaly noted  Hernia:  None appreciated  Skin  Inspection:  Grossly normal   Breasts: Examined lying and sitting.     Right: Without masses, retractions, discharge or axillary adenopathy.     Left: Without masses, retractions, discharge or axillary adenopathy. Gentitourinary   Inguinal/mons:  Normal without inguinal adenopathy  External genitalia:  Normal  BUS/Urethra/Skene's glands:  Normal  Vagina: +1 rectocele  Cervix:  Normal  Uterus:   normal in size, shape and contour.  Midline and mobile  Adnexa/parametria:     Rt: Without masses or tenderness.   Lt: Without masses or tenderness.  Anus and perineum: Normal  Digital rectal exam: Normal sphincter tone without palpated masses or tenderness  Assessment/Plan:  48 y.o. MWF  G2P72for annual exam with no complaints.  No or light monthly cycle/vasectomy/her option 2011 Stable pituitary adenoma on MRI on no medication Type 1 diabetes Dr. Chalmers Cater manages  Plan: SBE's, continue annual 3-D mammogram, history of dense breast. Continue regular exercise and healthy lifestyle, vitamin D 2000 daily encouraged. Pap normal 2014, new screening guidelines reviewed.   Huel Cote WHNP, 12:30 PM 05/01/2014

## 2014-05-01 NOTE — Patient Instructions (Signed)

## 2014-05-28 ENCOUNTER — Encounter: Payer: Self-pay | Admitting: Women's Health

## 2015-01-21 ENCOUNTER — Other Ambulatory Visit: Payer: Self-pay

## 2015-02-25 ENCOUNTER — Telehealth: Payer: Self-pay | Admitting: *Deleted

## 2015-02-25 MED ORDER — FLUCONAZOLE 150 MG PO TABS: ORAL_TABLET | ORAL | Status: DC

## 2015-02-25 NOTE — Telephone Encounter (Signed)
Ok for diflucan 150mg  repeat in 3 days if [place], #2 Office visit if no relief

## 2015-02-25 NOTE — Telephone Encounter (Signed)
Rx sent, pt aware with the below note.

## 2015-02-25 NOTE — Telephone Encounter (Signed)
(  Pt aware you are out of the office) pt called requesting Rx for yeast infection, Type I diabetic, requesting Diflucan tablet. Pt said OTC monistat doesn't help. Please advise

## 2015-08-01 ENCOUNTER — Other Ambulatory Visit: Payer: Self-pay

## 2015-08-01 DIAGNOSIS — Z1231 Encounter for screening mammogram for malignant neoplasm of breast: Secondary | ICD-10-CM

## 2015-08-21 ENCOUNTER — Ambulatory Visit
Admission: RE | Admit: 2015-08-21 | Discharge: 2015-08-21 | Disposition: A | Discharge status: Home / Self Care | Payer: 59 | Source: Ambulatory Visit

## 2015-08-21 DIAGNOSIS — Z1231 Encounter for screening mammogram for malignant neoplasm of breast: Secondary | ICD-10-CM

## 2015-08-22 ENCOUNTER — Other Ambulatory Visit: Payer: Self-pay | Admitting: Women's Health

## 2015-08-22 DIAGNOSIS — R928 Other abnormal and inconclusive findings on diagnostic imaging of breast: Secondary | ICD-10-CM

## 2015-08-27 ENCOUNTER — Ambulatory Visit
Admission: RE | Admit: 2015-08-27 | Discharge: 2015-08-27 | Disposition: A | Discharge status: Home / Self Care | Payer: 59 | Source: Ambulatory Visit | Attending: Women's Health | Admitting: Women's Health

## 2015-08-27 DIAGNOSIS — R928 Other abnormal and inconclusive findings on diagnostic imaging of breast: Secondary | ICD-10-CM

## 2015-08-28 ENCOUNTER — Encounter: Payer: Self-pay | Admitting: Women's Health

## 2015-08-30 ENCOUNTER — Ambulatory Visit (INDEPENDENT_AMBULATORY_CARE_PROVIDER_SITE_OTHER): Payer: 59 | Admitting: Women's Health

## 2015-08-30 ENCOUNTER — Other Ambulatory Visit (HOSPITAL_COMMUNITY)
Admission: RE | Admit: 2015-08-30 | Discharge: 2015-08-30 | Disposition: A | Discharge status: Home / Self Care | Payer: 59 | Source: Ambulatory Visit | Attending: Women's Health | Admitting: Women's Health

## 2015-08-30 ENCOUNTER — Encounter: Payer: Self-pay | Admitting: Women's Health

## 2015-08-30 VITALS — BP 110/78 | Ht 64.0 in | Wt 142.0 lb

## 2015-08-30 DIAGNOSIS — Z01419 Encounter for gynecological examination (general) (routine) without abnormal findings: Secondary | ICD-10-CM | POA: Insufficient documentation

## 2015-08-30 DIAGNOSIS — E221 Hyperprolactinemia: Secondary | ICD-10-CM

## 2015-08-30 DIAGNOSIS — N951 Menopausal and female climacteric states: Secondary | ICD-10-CM

## 2015-08-30 DIAGNOSIS — Z1151 Encounter for screening for human papillomavirus (HPV): Secondary | ICD-10-CM | POA: Insufficient documentation

## 2015-08-30 LAB — URINALYSIS W MICROSCOPIC + REFLEX CULTURE
BACTERIA UA: NONE SEEN [HPF]
BILIRUBIN URINE: NEGATIVE
CASTS: NONE SEEN [LPF]
CRYSTALS: NONE SEEN [HPF]
Glucose, UA: NEGATIVE
HGB URINE DIPSTICK: NEGATIVE
KETONES UR: NEGATIVE
Nitrite: NEGATIVE
Protein, ur: NEGATIVE
SPECIFIC GRAVITY, URINE: 1.023 (ref 1.001–1.035)
WBC UA: NONE SEEN WBC/HPF (ref <=5)
Yeast: NONE SEEN [HPF]
pH: 6.5 (ref 5.0–8.0)

## 2015-08-30 NOTE — Patient Instructions (Signed)
Health Maintenance, Female Adopting a healthy lifestyle and getting preventive care can go a long way to promote health and wellness. Talk with your health care provider about what schedule of regular examinations is right for you. This is a good chance for you to check in with your provider about disease prevention and staying healthy. In between checkups, there are plenty of things you can do on your own. Experts have done a lot of research about which lifestyle changes and preventive measures are most likely to keep you healthy. Ask your health care provider for more information. WEIGHT AND DIET  Eat a healthy diet  Be sure to include plenty of vegetables, fruits, low-fat dairy products, and lean protein.  Do not eat a lot of foods high in solid fats, added sugars, or salt.  Get regular exercise. This is one of the most important things you can do for your health.  Most adults should exercise for at least 150 minutes each week. The exercise should increase your heart rate and make you sweat (moderate-intensity exercise).  Most adults should also do strengthening exercises at least twice a week. This is in addition to the moderate-intensity exercise.  Maintain a healthy weight  Body mass index (BMI) is a measurement that can be used to identify possible weight problems. It estimates body fat based on height and weight. Your health care provider can help determine your BMI and help you achieve or maintain a healthy weight.  For females 20 years of age and older:   A BMI below 18.5 is considered underweight.  A BMI of 18.5 to 24.9 is normal.  A BMI of 25 to 29.9 is considered overweight.  A BMI of 30 and above is considered obese.  Watch levels of cholesterol and blood lipids  You should start having your blood tested for lipids and cholesterol at 51 years of age, then have this test every 5 years.  You may need to have your cholesterol levels checked more often if:  Your lipid  or cholesterol levels are high.  You are older than 50 years of age.  You are at high risk for heart disease.  CANCER SCREENING   Lung Cancer  Lung cancer screening is recommended for adults 55-80 years old who are at high risk for lung cancer because of a history of smoking.  A yearly low-dose CT scan of the lungs is recommended for people who:  Currently smoke.  Have quit within the past 15 years.  Have at least a 30-pack-year history of smoking. A pack year is smoking an average of one pack of cigarettes a day for 1 year.  Yearly screening should continue until it has been 15 years since you quit.  Yearly screening should stop if you develop a health problem that would prevent you from having lung cancer treatment.  Breast Cancer  Practice breast self-awareness. This means understanding how your breasts normally appear and feel.  It also means doing regular breast self-exams. Let your health care provider know about any changes, no matter how small.  If you are in your 20s or 30s, you should have a clinical breast exam (CBE) by a health care provider every 1-3 years as part of a regular health exam.  If you are 40 or older, have a CBE every year. Also consider having a breast X-ray (mammogram) every year.  If you have a family history of breast cancer, talk to your health care provider about genetic screening.  If you   are at high risk for breast cancer, talk to your health care provider about having an MRI and a mammogram every year.  Breast cancer gene (BRCA) assessment is recommended for women who have family members with BRCA-related cancers. BRCA-related cancers include:  Breast.  Ovarian.  Tubal.  Peritoneal cancers.  Results of the assessment will determine the need for genetic counseling and BRCA1 and BRCA2 testing. Cervical Cancer Your health care provider may recommend that you be screened regularly for cancer of the pelvic organs (ovaries, uterus, and  vagina). This screening involves a pelvic examination, including checking for microscopic changes to the surface of your cervix (Pap test). You may be encouraged to have this screening done every 3 years, beginning at age 21.  For women ages 30-65, health care providers may recommend pelvic exams and Pap testing every 3 years, or they may recommend the Pap and pelvic exam, combined with testing for human papilloma virus (HPV), every 5 years. Some types of HPV increase your risk of cervical cancer. Testing for HPV may also be done on women of any age with unclear Pap test results.  Other health care providers may not recommend any screening for nonpregnant women who are considered low risk for pelvic cancer and who do not have symptoms. Ask your health care provider if a screening pelvic exam is right for you.  If you have had past treatment for cervical cancer or a condition that could lead to cancer, you need Pap tests and screening for cancer for at least 20 years after your treatment. If Pap tests have been discontinued, your risk factors (such as having a new sexual partner) need to be reassessed to determine if screening should resume. Some women have medical problems that increase the chance of getting cervical cancer. In these cases, your health care provider may recommend more frequent screening and Pap tests. Colorectal Cancer  This type of cancer can be detected and often prevented.  Routine colorectal cancer screening usually begins at 50 years of age and continues through 51 years of age.  Your health care provider may recommend screening at an earlier age if you have risk factors for colon cancer.  Your health care provider may also recommend using home test kits to check for hidden blood in the stool.  A small camera at the end of a tube can be used to examine your colon directly (sigmoidoscopy or colonoscopy). This is done to check for the earliest forms of colorectal  cancer.  Routine screening usually begins at age 50.  Direct examination of the colon should be repeated every 5-10 years through 51 years of age. However, you may need to be screened more often if early forms of precancerous polyps or small growths are found. Skin Cancer  Check your skin from head to toe regularly.  Tell your health care provider about any new moles or changes in moles, especially if there is a change in a mole's shape or color.  Also tell your health care provider if you have a mole that is larger than the size of a pencil eraser.  Always use sunscreen. Apply sunscreen liberally and repeatedly throughout the day.  Protect yourself by wearing long sleeves, pants, a wide-brimmed hat, and sunglasses whenever you are outside. HEART DISEASE, DIABETES, AND HIGH BLOOD PRESSURE   High blood pressure causes heart disease and increases the risk of stroke. High blood pressure is more likely to develop in:  People who have blood pressure in the high end   of the normal range (130-139/85-89 mm Hg).  People who are overweight or obese.  People who are African American.  If you are 38-23 years of age, have your blood pressure checked every 3-5 years. If you are 61 years of age or older, have your blood pressure checked every year. You should have your blood pressure measured twice--once when you are at a hospital or clinic, and once when you are not at a hospital or clinic. Record the average of the two measurements. To check your blood pressure when you are not at a hospital or clinic, you can use:  An automated blood pressure machine at a pharmacy.  A home blood pressure monitor.  If you are between 45 years and 39 years old, ask your health care provider if you should take aspirin to prevent strokes.  Have regular diabetes screenings. This involves taking a blood sample to check your fasting blood sugar level.  If you are at a normal weight and have a low risk for diabetes,  have this test once every three years after 51 years of age.  If you are overweight and have a high risk for diabetes, consider being tested at a younger age or more often. PREVENTING INFECTION  Hepatitis B  If you have a higher risk for hepatitis B, you should be screened for this virus. You are considered at high risk for hepatitis B if:  You were born in a country where hepatitis B is common. Ask your health care provider which countries are considered high risk.  Your parents were born in a high-risk country, and you have not been immunized against hepatitis B (hepatitis B vaccine).  You have HIV or AIDS.  You use needles to inject street drugs.  You live with someone who has hepatitis B.  You have had sex with someone who has hepatitis B.  You get hemodialysis treatment.  You take certain medicines for conditions, including cancer, organ transplantation, and autoimmune conditions. Hepatitis C  Blood testing is recommended for:  Everyone born from [birth year] through 1966.  Anyone with known risk factors for hepatitis C. Sexually transmitted infections (STIs)  You should be screened for sexually transmitted infections (STIs) including gonorrhea and chlamydia if:  You are sexually active and are younger than 51 years of age.  You are older than 51 years of age and your health care provider tells you that you are at risk for this type of infection.  Your sexual activity has changed since you were last screened and you are at an increased risk for chlamydia or gonorrhea. Ask your health care provider if you are at risk.  If you do not have HIV, but are at risk, it may be recommended that you take a prescription medicine daily to prevent HIV infection. This is called pre-exposure prophylaxis (PrEP). You are considered at risk if:  You are sexually active and do not regularly use condoms or know the HIV status of your partner(s).  You take drugs by injection.  You are sexually  active with a partner who has HIV. Talk with your health care provider about whether you are at high risk of being infected with HIV. If you choose to begin PrEP, you should first be tested for HIV. You should then be tested every 3 months for as long as you are taking PrEP.  PREGNANCY   If you are premenopausal and you may become pregnant, ask your health care provider about preconception counseling.  If you may  become pregnant, take 400 to 800 micrograms (mcg) of folic acid every day.  If you want to prevent pregnancy, talk to your health care provider about birth control (contraception). OSTEOPOROSIS AND MENOPAUSE   Osteoporosis is a disease in which the bones lose minerals and strength with aging. This can result in serious bone fractures. Your risk for osteoporosis can be identified using a bone density scan.  If you are 61 years of age or older, or if you are at risk for osteoporosis and fractures, ask your health care provider if you should be screened.  Ask your health care provider whether you should take a calcium or vitamin D supplement to lower your risk for osteoporosis.  Menopause may have certain physical symptoms and risks.  Hormone replacement therapy may reduce some of these symptoms and risks. Talk to your health care provider about whether hormone replacement therapy is right for you.  HOME CARE INSTRUCTIONS   Schedule regular health, dental, and eye exams.  Stay current with your immunizations.   Do not use any tobacco products including cigarettes, chewing tobacco, or electronic cigarettes.  If you are pregnant, do not drink alcohol.  If you are breastfeeding, limit how much and how often you drink alcohol.  Limit alcohol intake to no more than 1 drink per day for nonpregnant women. One drink equals 12 ounces of beer, 5 ounces of wine, or 1 ounces of hard liquor.  Do not use street drugs.  Do not share needles.  Ask your health care provider for help if  you need support or information about quitting drugs.  Tell your health care provider if you often feel depressed.  Tell your health care provider if you have ever been abused or do not feel safe at home.   This information is not intended to replace advice given to you by your health care provider. Make sure you discuss any questions you have with your health care provider.   Document Released: 01/26/2011 Document Revised: 08/03/2014 Document Reviewed: 06/14/2013 Elsevier Interactive Patient Education [employer].

## 2015-08-30 NOTE — Progress Notes (Signed)
Talissa Lahti Oct 06, 1965 [id number]    History:    Presents for annual exam.  Normal PAP and  mammogram history. Uterine ablation 2011. Monthly cycles with little to no bleeding or discomfort/vasectomy.  Minimal menopausal symptoms. Type I diabetic since age 50  would like referral for new endocrinologist. History of a pituitary adenoma 2007 stable 4-5 mm on MRI 2015, on no medication. Normal prolactin 2015. 1994 laser cervix was normal Paps after. History of a negative colonoscopy 2 years ago.  Past medical history, past surgical history, family history and social history were all reviewed and documented in the EPIC chart.  Married, not working outside the home, 2 daughters, one at [employer], one senior in high school,  doing well.  ROS:  A ROS was performed and pertinent positives and negatives are included.  Exam:  Filed Vitals:   08/30/15 1121  BP: 110/78    General appearance:  Normal Thyroid:  Symmetrical, normal in size, without palpable masses or nodularity. Respiratory  Auscultation:  Clear without wheezing or rhonchi Cardiovascular  Auscultation:  Regular rate, without rubs, murmurs or gallops  Edema/varicosities:  Not grossly evident Abdominal  Soft,nontender, without masses, guarding or rebound.  Liver/spleen:  No organomegaly noted  Hernia:  None appreciated  Skin  Inspection:  Grossly normal   Breasts: Examined lying and sitting.     Right: Without masses, retractions, discharge or axillary adenopathy.     Left: Without masses, retractions, discharge or axillary adenopathy. Gentitourinary   Inguinal/mons:  Normal without inguinal adenopathy  External genitalia:  Normal  BUS/Urethra/Skene's glands:  Normal  Vagina:  Normal  Cervix:  Normal  Uterus:  normal in size, shape and contour.  Midline and mobile  Adnexa/parametria:     Rt: Without masses or tenderness.   Lt: Without masses or tenderness.  Anus and perineum: Normal  Digital rectal  exam: Normal sphincter tone without palpated masses or tenderness  Assessment/Plan:  50 y.o. MWF G2 P2  presents for annual exam. No concerns.  No or light monthly cycle/uterine ablation 2011/vasectomy Stable pituitary adenoma on MRI on no medication Type 1 diabetes managed by endocrinology  Plan: SBE's, continue annual 3-D mammogram, history of dense breast. Continue regular exercise and healthy lifestyle, calcium and vitamin D 2000 daily encouraged. U/A, Pap with HPV reflex, Prolactin, FSH. We'll get referral for endocrinologist Dr. Rae Halsted [facility], 12:30 PM 08/30/2015

## 2015-08-31 LAB — FOLLICLE STIMULATING HORMONE: FSH: 3 m[IU]/mL

## 2015-08-31 LAB — PROLACTIN: Prolactin: 15.8 ng/mL

## 2015-09-01 LAB — URINE CULTURE

## 2015-09-03 LAB — CYTOLOGY - PAP

## 2015-12-24 DIAGNOSIS — E103293 Type 1 diabetes mellitus with mild nonproliferative diabetic retinopathy without macular edema, bilateral: Secondary | ICD-10-CM | POA: Insufficient documentation

## 2016-04-17 DIAGNOSIS — M7502 Adhesive capsulitis of left shoulder: Secondary | ICD-10-CM | POA: Diagnosis not present

## 2016-04-17 DIAGNOSIS — M7542 Impingement syndrome of left shoulder: Secondary | ICD-10-CM | POA: Diagnosis not present

## 2016-05-19 DIAGNOSIS — M7502 Adhesive capsulitis of left shoulder: Secondary | ICD-10-CM | POA: Diagnosis not present

## 2016-05-20 DIAGNOSIS — M7542 Impingement syndrome of left shoulder: Secondary | ICD-10-CM | POA: Diagnosis not present

## 2016-05-20 DIAGNOSIS — M7502 Adhesive capsulitis of left shoulder: Secondary | ICD-10-CM | POA: Diagnosis not present

## 2016-05-20 DIAGNOSIS — M25512 Pain in left shoulder: Secondary | ICD-10-CM | POA: Diagnosis not present

## 2016-05-21 DIAGNOSIS — M7502 Adhesive capsulitis of left shoulder: Secondary | ICD-10-CM | POA: Diagnosis not present

## 2016-05-26 DIAGNOSIS — M7502 Adhesive capsulitis of left shoulder: Secondary | ICD-10-CM | POA: Diagnosis not present

## 2016-05-28 DIAGNOSIS — M7502 Adhesive capsulitis of left shoulder: Secondary | ICD-10-CM | POA: Diagnosis not present

## 2016-06-02 ENCOUNTER — Ambulatory Visit (INDEPENDENT_AMBULATORY_CARE_PROVIDER_SITE_OTHER): Payer: BLUE CROSS/BLUE SHIELD | Admitting: Women's Health

## 2016-06-02 ENCOUNTER — Encounter: Payer: Self-pay | Admitting: Women's Health

## 2016-06-02 VITALS — Ht 64.0 in | Wt 145.0 lb

## 2016-06-02 DIAGNOSIS — Z01419 Encounter for gynecological examination (general) (routine) without abnormal findings: Secondary | ICD-10-CM

## 2016-06-02 DIAGNOSIS — M7502 Adhesive capsulitis of left shoulder: Secondary | ICD-10-CM | POA: Diagnosis not present

## 2016-06-02 NOTE — Progress Notes (Addendum)
Maria White 12-09-1965 [id number]    History:    Presents for annual exam.  Irregular light cycles history of ablation. Vasectomy. Normal Pap and mammogram history. Type 1 diabetes age 50 on insulin has a continuous glucose monitoring now which has been very helpful.   Past medical history, past surgical history, family history and social history were all reviewed and documented in the EPIC chart. Daughters  senior in college with a 4.0 and youngest daughter freshman at [employer] on the cheerleading squad doing well, med school goal.  ROS:  A ROS was performed and pertinent positives and negatives are included.  Exam:  Vitals:   06/02/16 1405  Weight: 145 lb (65.8 kg)  Height: 5\' 4"  (1.626 m)   Body mass index is 24.89 kg/m.   General appearance:  Normal Thyroid:  Symmetrical, normal in size, without palpable masses or nodularity. Respiratory  Auscultation:  Clear without wheezing or rhonchi Cardiovascular  Auscultation:  Regular rate, without rubs, murmurs or gallops  Edema/varicosities:  Not grossly evident Abdominal  Soft,nontender, without masses, guarding or rebound.  Liver/spleen:  No organomegaly noted  Hernia:  None appreciated  Skin  Inspection:  Grossly normal   Breasts: Examined lying and sitting.     Right: Without masses, retractions, discharge or axillary adenopathy.     Left: Without masses, retractions, discharge or axillary adenopathy. Gentitourinary   Inguinal/mons:  Normal without inguinal adenopathy  External genitalia:  Normal  BUS/Urethra/Skene's glands:  Normal  Vagina:  Normal  Cervix:  Normal  Uterus:   normal in size, shape and contour.  Midline and mobile  Adnexa/parametria:     Rt: Without masses or tenderness.   Lt: Without masses or tenderness.  Anus and perineum: Normal  Digital rectal exam: Normal sphincter tone without palpated masses or tenderness  Assessment/Plan:  50 y.o. MWF G3 P2 for annual exam with no complaints.  Irregular  light cycles/ablation/vasectomy minimal menopausal symptoms Type 1 diabetes-endocrinologist manages labs and meds Questionable left breast nodule/not palpable (strong family history of breast cancer   mother (surviror), maternal grandmother and maternal great-grandmother.)  Plan: Menopause reviewed, SBE's, annual screening mammogram, calcium rich diet, vitamin D 1000 daily encouraged. UA, Pap normal with negative HR HPV 2017, new screening guidelines reviewed. Reports palpable nodule for the last 4-5 weeks left breast upper inner aspect at 10:00 no longer feeling it today we'll get diagnostic mammogram. Screening colonoscopy reviewed, [facility] information given instructed to schedule.      Huel Cote [facility], 4:06 PM 06/02/2016

## 2016-06-02 NOTE — Patient Instructions (Signed)
Health Maintenance, Female Adopting a healthy lifestyle and getting preventive care can go a long way to promote health and wellness. Talk with your health care provider about what schedule of regular examinations is right for you. This is a good chance for you to check in with your provider about disease prevention and staying healthy. In between checkups, there are plenty of things you can do on your own. Experts have done a lot of research about which lifestyle changes and preventive measures are most likely to keep you healthy. Ask your health care provider for more information. WEIGHT AND DIET  Eat a healthy diet  Be sure to include plenty of vegetables, fruits, low-fat dairy products, and lean protein.  Do not eat a lot of foods high in solid fats, added sugars, or salt.  Get regular exercise. This is one of the most important things you can do for your health.  Most adults should exercise for at least 150 minutes each week. The exercise should increase your heart rate and make you sweat (moderate-intensity exercise).  Most adults should also do strengthening exercises at least twice a week. This is in addition to the moderate-intensity exercise.  Maintain a healthy weight  Body mass index (BMI) is a measurement that can be used to identify possible weight problems. It estimates body fat based on height and weight. Your health care provider can help determine your BMI and help you achieve or maintain a healthy weight.  For females 20 years of age and older:   A BMI below 18.5 is considered underweight.  A BMI of 18.5 to 24.9 is normal.  A BMI of 25 to 29.9 is considered overweight.  A BMI of 30 and above is considered obese.  Watch levels of cholesterol and blood lipids  You should start having your blood tested for lipids and cholesterol at 51 years of age, then have this test every 5 years.  You may need to have your cholesterol levels checked more often if:  Your lipid  or cholesterol levels are high.  You are older than 50 years of age.  You are at high risk for heart disease.  CANCER SCREENING   Lung Cancer  Lung cancer screening is recommended for adults 55-80 years old who are at high risk for lung cancer because of a history of smoking.  A yearly low-dose CT scan of the lungs is recommended for people who:  Currently smoke.  Have quit within the past 15 years.  Have at least a 30-pack-year history of smoking. A pack year is smoking an average of one pack of cigarettes a day for 1 year.  Yearly screening should continue until it has been 15 years since you quit.  Yearly screening should stop if you develop a health problem that would prevent you from having lung cancer treatment.  Breast Cancer  Practice breast self-awareness. This means understanding how your breasts normally appear and feel.  It also means doing regular breast self-exams. Let your health care provider know about any changes, no matter how small.  If you are in your 20s or 30s, you should have a clinical breast exam (CBE) by a health care provider every 1-3 years as part of a regular health exam.  If you are 40 or older, have a CBE every year. Also consider having a breast X-ray (mammogram) every year.  If you have a family history of breast cancer, talk to your health care provider about genetic screening.  If you   are at high risk for breast cancer, talk to your health care provider about having an MRI and a mammogram every year.  Breast cancer gene (BRCA) assessment is recommended for women who have family members with BRCA-related cancers. BRCA-related cancers include:  Breast.  Ovarian.  Tubal.  Peritoneal cancers.  Results of the assessment will determine the need for genetic counseling and BRCA1 and BRCA2 testing. Cervical Cancer Your health care provider may recommend that you be screened regularly for cancer of the pelvic organs (ovaries, uterus, and  vagina). This screening involves a pelvic examination, including checking for microscopic changes to the surface of your cervix (Pap test). You may be encouraged to have this screening done every 3 years, beginning at age 21.  For women ages 30-65, health care providers may recommend pelvic exams and Pap testing every 3 years, or they may recommend the Pap and pelvic exam, combined with testing for human papilloma virus (HPV), every 5 years. Some types of HPV increase your risk of cervical cancer. Testing for HPV may also be done on women of any age with unclear Pap test results.  Other health care providers may not recommend any screening for nonpregnant women who are considered low risk for pelvic cancer and who do not have symptoms. Ask your health care provider if a screening pelvic exam is right for you.  If you have had past treatment for cervical cancer or a condition that could lead to cancer, you need Pap tests and screening for cancer for at least 20 years after your treatment. If Pap tests have been discontinued, your risk factors (such as having a new sexual partner) need to be reassessed to determine if screening should resume. Some women have medical problems that increase the chance of getting cervical cancer. In these cases, your health care provider may recommend more frequent screening and Pap tests. Colorectal Cancer  This type of cancer can be detected and often prevented.  Routine colorectal cancer screening usually begins at 50 years of age and continues through 51 years of age.  Your health care provider may recommend screening at an earlier age if you have risk factors for colon cancer.  Your health care provider may also recommend using home test kits to check for hidden blood in the stool.  A small camera at the end of a tube can be used to examine your colon directly (sigmoidoscopy or colonoscopy). This is done to check for the earliest forms of colorectal  cancer.  Routine screening usually begins at age 50.  Direct examination of the colon should be repeated every 5-10 years through 51 years of age. However, you may need to be screened more often if early forms of precancerous polyps or small growths are found. Skin Cancer  Check your skin from head to toe regularly.  Tell your health care provider about any new moles or changes in moles, especially if there is a change in a mole's shape or color.  Also tell your health care provider if you have a mole that is larger than the size of a pencil eraser.  Always use sunscreen. Apply sunscreen liberally and repeatedly throughout the day.  Protect yourself by wearing long sleeves, pants, a wide-brimmed hat, and sunglasses whenever you are outside. HEART DISEASE, DIABETES, AND HIGH BLOOD PRESSURE   High blood pressure causes heart disease and increases the risk of stroke. High blood pressure is more likely to develop in:  People who have blood pressure in the high end   of the normal range (130-139/85-89 mm Hg).  People who are overweight or obese.  People who are African American.  If you are 38-23 years of age, have your blood pressure checked every 3-5 years. If you are 61 years of age or older, have your blood pressure checked every year. You should have your blood pressure measured twice--once when you are at a hospital or clinic, and once when you are not at a hospital or clinic. Record the average of the two measurements. To check your blood pressure when you are not at a hospital or clinic, you can use:  An automated blood pressure machine at a pharmacy.  A home blood pressure monitor.  If you are between 45 years and 39 years old, ask your health care provider if you should take aspirin to prevent strokes.  Have regular diabetes screenings. This involves taking a blood sample to check your fasting blood sugar level.  If you are at a normal weight and have a low risk for diabetes,  have this test once every three years after 51 years of age.  If you are overweight and have a high risk for diabetes, consider being tested at a younger age or more often. PREVENTING INFECTION  Hepatitis B  If you have a higher risk for hepatitis B, you should be screened for this virus. You are considered at high risk for hepatitis B if:  You were born in a country where hepatitis B is common. Ask your health care provider which countries are considered high risk.  Your parents were born in a high-risk country, and you have not been immunized against hepatitis B (hepatitis B vaccine).  You have HIV or AIDS.  You use needles to inject street drugs.  You live with someone who has hepatitis B.  You have had sex with someone who has hepatitis B.  You get hemodialysis treatment.  You take certain medicines for conditions, including cancer, organ transplantation, and autoimmune conditions. Hepatitis C  Blood testing is recommended for:  Everyone born from [birth year] through 1966.  Anyone with known risk factors for hepatitis C. Sexually transmitted infections (STIs)  You should be screened for sexually transmitted infections (STIs) including gonorrhea and chlamydia if:  You are sexually active and are younger than 51 years of age.  You are older than 51 years of age and your health care provider tells you that you are at risk for this type of infection.  Your sexual activity has changed since you were last screened and you are at an increased risk for chlamydia or gonorrhea. Ask your health care provider if you are at risk.  If you do not have HIV, but are at risk, it may be recommended that you take a prescription medicine daily to prevent HIV infection. This is called pre-exposure prophylaxis (PrEP). You are considered at risk if:  You are sexually active and do not regularly use condoms or know the HIV status of your partner(s).  You take drugs by injection.  You are sexually  active with a partner who has HIV. Talk with your health care provider about whether you are at high risk of being infected with HIV. If you choose to begin PrEP, you should first be tested for HIV. You should then be tested every 3 months for as long as you are taking PrEP.  PREGNANCY   If you are premenopausal and you may become pregnant, ask your health care provider about preconception counseling.  If you may  become pregnant, take 400 to 800 micrograms (mcg) of folic acid every day.  If you want to prevent pregnancy, talk to your health care provider about birth control (contraception). OSTEOPOROSIS AND MENOPAUSE   Osteoporosis is a disease in which the bones lose minerals and strength with aging. This can result in serious bone fractures. Your risk for osteoporosis can be identified using a bone density scan.  If you are 61 years of age or older, or if you are at risk for osteoporosis and fractures, ask your health care provider if you should be screened.  Ask your health care provider whether you should take a calcium or vitamin D supplement to lower your risk for osteoporosis.  Menopause may have certain physical symptoms and risks.  Hormone replacement therapy may reduce some of these symptoms and risks. Talk to your health care provider about whether hormone replacement therapy is right for you.  HOME CARE INSTRUCTIONS   Schedule regular health, dental, and eye exams.  Stay current with your immunizations.   Do not use any tobacco products including cigarettes, chewing tobacco, or electronic cigarettes.  If you are pregnant, do not drink alcohol.  If you are breastfeeding, limit how much and how often you drink alcohol.  Limit alcohol intake to no more than 1 drink per day for nonpregnant women. One drink equals 12 ounces of beer, 5 ounces of wine, or 1 ounces of hard liquor.  Do not use street drugs.  Do not share needles.  Ask your health care provider for help if  you need support or information about quitting drugs.  Tell your health care provider if you often feel depressed.  Tell your health care provider if you have ever been abused or do not feel safe at home.   This information is not intended to replace advice given to you by your health care provider. Make sure you discuss any questions you have with your health care provider.   Document Released: 01/26/2011 Document Revised: 08/03/2014 Document Reviewed: 06/14/2013 Elsevier Interactive Patient Education [employer].

## 2016-06-03 ENCOUNTER — Telehealth: Payer: Self-pay | Admitting: *Deleted

## 2016-06-03 DIAGNOSIS — N63 Unspecified lump in unspecified breast: Secondary | ICD-10-CM

## 2016-06-03 LAB — URINALYSIS W MICROSCOPIC + REFLEX CULTURE
Bacteria, UA: NONE SEEN [HPF]
Bilirubin Urine: NEGATIVE
CASTS: NONE SEEN [LPF]
Crystals: NONE SEEN [HPF]
Glucose, UA: NEGATIVE
HGB URINE DIPSTICK: NEGATIVE
Ketones, ur: NEGATIVE
NITRITE: NEGATIVE
PH: 7 (ref 5.0–8.0)
Protein, ur: NEGATIVE
SPECIFIC GRAVITY, URINE: 1.017 (ref 1.001–1.035)
SQUAMOUS EPITHELIAL / LPF: NONE SEEN [HPF] (ref <=5)
WBC, UA: NONE SEEN WBC/HPF (ref <=5)
YEAST: NONE SEEN [HPF]

## 2016-06-03 NOTE — Telephone Encounter (Signed)
-----   Message from Huel Cote, NP sent at 06/02/2016  2:31 PM EST ----- Please schedule diag mammogram left breast had a palpable nodule size of a pea on upper inner aspect 10:00 for 4-5 weeks now not palpable, she can go anytime, breast center is where her past mammograms, m, mgm, mggm breast cancer.

## 2016-06-03 NOTE — Telephone Encounter (Signed)
Appointment on 06/08/16 @ 7:40am pt aware.

## 2016-06-04 DIAGNOSIS — M7502 Adhesive capsulitis of left shoulder: Secondary | ICD-10-CM | POA: Diagnosis not present

## 2016-06-04 LAB — URINE CULTURE

## 2016-06-08 ENCOUNTER — Encounter: Payer: Self-pay | Admitting: Women's Health

## 2016-06-08 ENCOUNTER — Ambulatory Visit
Admission: RE | Admit: 2016-06-08 | Discharge: 2016-06-08 | Disposition: A | Discharge status: Home / Self Care | Payer: No Typology Code available for payment source | Source: Ambulatory Visit | Attending: Women's Health | Admitting: Women's Health

## 2016-06-08 DIAGNOSIS — E7800 Pure hypercholesterolemia, unspecified: Secondary | ICD-10-CM | POA: Diagnosis not present

## 2016-06-08 DIAGNOSIS — E109 Type 1 diabetes mellitus without complications: Secondary | ICD-10-CM | POA: Diagnosis not present

## 2016-06-08 DIAGNOSIS — E02 Subclinical iodine-deficiency hypothyroidism: Secondary | ICD-10-CM | POA: Diagnosis not present

## 2016-06-08 DIAGNOSIS — R922 Inconclusive mammogram: Secondary | ICD-10-CM | POA: Diagnosis not present

## 2016-06-08 DIAGNOSIS — E221 Hyperprolactinemia: Secondary | ICD-10-CM | POA: Diagnosis not present

## 2016-06-08 DIAGNOSIS — N63 Unspecified lump in unspecified breast: Secondary | ICD-10-CM

## 2016-06-08 DIAGNOSIS — M7502 Adhesive capsulitis of left shoulder: Secondary | ICD-10-CM | POA: Diagnosis not present

## 2016-06-08 DIAGNOSIS — M25512 Pain in left shoulder: Secondary | ICD-10-CM | POA: Diagnosis not present

## 2016-06-09 DIAGNOSIS — M7502 Adhesive capsulitis of left shoulder: Secondary | ICD-10-CM | POA: Diagnosis not present

## 2016-06-11 DIAGNOSIS — M7502 Adhesive capsulitis of left shoulder: Secondary | ICD-10-CM | POA: Diagnosis not present

## 2016-06-15 DIAGNOSIS — E221 Hyperprolactinemia: Secondary | ICD-10-CM | POA: Diagnosis not present

## 2016-06-15 DIAGNOSIS — D443 Neoplasm of uncertain behavior of pituitary gland: Secondary | ICD-10-CM | POA: Diagnosis not present

## 2016-06-15 DIAGNOSIS — E7800 Pure hypercholesterolemia, unspecified: Secondary | ICD-10-CM | POA: Diagnosis not present

## 2016-06-15 DIAGNOSIS — E109 Type 1 diabetes mellitus without complications: Secondary | ICD-10-CM | POA: Diagnosis not present

## 2016-06-16 DIAGNOSIS — M7502 Adhesive capsulitis of left shoulder: Secondary | ICD-10-CM | POA: Diagnosis not present

## 2016-06-23 DIAGNOSIS — M7502 Adhesive capsulitis of left shoulder: Secondary | ICD-10-CM | POA: Diagnosis not present

## 2016-06-25 DIAGNOSIS — M7502 Adhesive capsulitis of left shoulder: Secondary | ICD-10-CM | POA: Diagnosis not present

## 2016-07-02 DIAGNOSIS — M7502 Adhesive capsulitis of left shoulder: Secondary | ICD-10-CM | POA: Diagnosis not present

## 2016-07-03 DIAGNOSIS — M7502 Adhesive capsulitis of left shoulder: Secondary | ICD-10-CM | POA: Diagnosis not present

## 2016-07-09 DIAGNOSIS — M7502 Adhesive capsulitis of left shoulder: Secondary | ICD-10-CM | POA: Diagnosis not present

## 2016-08-21 ENCOUNTER — Other Ambulatory Visit: Payer: Self-pay | Admitting: Women's Health

## 2016-08-21 DIAGNOSIS — Z1231 Encounter for screening mammogram for malignant neoplasm of breast: Secondary | ICD-10-CM

## 2016-09-15 ENCOUNTER — Ambulatory Visit
Admission: RE | Admit: 2016-09-15 | Discharge: 2016-09-15 | Disposition: A | Discharge status: Home / Self Care | Payer: BLUE CROSS/BLUE SHIELD | Source: Ambulatory Visit | Attending: Women's Health | Admitting: Women's Health

## 2016-09-15 DIAGNOSIS — Z1231 Encounter for screening mammogram for malignant neoplasm of breast: Secondary | ICD-10-CM

## 2016-09-16 ENCOUNTER — Encounter: Payer: Self-pay | Admitting: Women's Health

## 2016-11-16 ENCOUNTER — Telehealth: Payer: Self-pay | Admitting: *Deleted

## 2016-11-16 ENCOUNTER — Encounter: Payer: Self-pay | Admitting: Women's Health

## 2016-11-16 ENCOUNTER — Ambulatory Visit (INDEPENDENT_AMBULATORY_CARE_PROVIDER_SITE_OTHER): Payer: BLUE CROSS/BLUE SHIELD | Admitting: Women's Health

## 2016-11-16 VITALS — BP 122/86 | Ht 64.25 in | Wt 149.4 lb

## 2016-11-16 DIAGNOSIS — N644 Mastodynia: Secondary | ICD-10-CM | POA: Diagnosis not present

## 2016-11-16 DIAGNOSIS — E1010 Type 1 diabetes mellitus with ketoacidosis without coma: Secondary | ICD-10-CM

## 2016-11-16 NOTE — Progress Notes (Signed)
Presents with breast discomfort in left upper outer quadrant near axilla for the past 2 weeks. Is using increased arm weights with exercise, history of a left frozen shoulder some relief with  exercise/physical therapy. Mobility has increased. 08/2016 normal 3-D tomography. Denies injury, minimal caffeine use. No family history of breast cancer. Type 1 diabetes on insulin good control. Monthly cycle, minimal menopausal symptoms. Vasectomy.  Exam: Appears well. Breast exam and sitting and lying position without visible dimpling, retractions or erythema. No palpable nodules, no nipple discharge.   Left breast tenderness axilla area  Plan: Reviewed normality of exam, will watch at this time, reviewed most likely related to exercise with weights. Instructed to call if changes in exam or pain persists after in a more routine weight/exercise program will proceed with diagnostic mammogram..

## 2016-11-16 NOTE — Telephone Encounter (Signed)
Referral placed at [facility]. They will contact pt to schedule.

## 2016-11-16 NOTE — Telephone Encounter (Signed)
-----   Message from Huel Cote, NP sent at 11/16/2016 10:44 AM EDT ----- Patient is a type I diabetic a diagnosed at age 51 currently a patient of Dr. Chalmers Cater, would like referral to Dr  Cruzita Lederer not an emergency. thanks

## 2016-11-25 NOTE — Telephone Encounter (Signed)
[facility] has left message x 2 to schedule.

## 2016-12-09 ENCOUNTER — Encounter: Payer: Self-pay | Admitting: Gynecology

## 2016-12-15 NOTE — Telephone Encounter (Signed)
Referral closed due to patient not returning endo call.

## 2016-12-16 ENCOUNTER — Encounter: Payer: Self-pay | Admitting: Endocrinology

## 2016-12-16 DIAGNOSIS — M25512 Pain in left shoulder: Secondary | ICD-10-CM | POA: Diagnosis not present

## 2016-12-16 DIAGNOSIS — M7502 Adhesive capsulitis of left shoulder: Secondary | ICD-10-CM | POA: Diagnosis not present

## 2017-01-11 DIAGNOSIS — L821 Other seborrheic keratosis: Secondary | ICD-10-CM | POA: Diagnosis not present

## 2017-01-11 DIAGNOSIS — L309 Dermatitis, unspecified: Secondary | ICD-10-CM | POA: Diagnosis not present

## 2017-03-19 DIAGNOSIS — L821 Other seborrheic keratosis: Secondary | ICD-10-CM | POA: Diagnosis not present

## 2017-03-19 DIAGNOSIS — D485 Neoplasm of uncertain behavior of skin: Secondary | ICD-10-CM | POA: Diagnosis not present

## 2017-03-19 DIAGNOSIS — D2239 Melanocytic nevi of other parts of face: Secondary | ICD-10-CM | POA: Diagnosis not present

## 2017-03-19 DIAGNOSIS — D224 Melanocytic nevi of scalp and neck: Secondary | ICD-10-CM | POA: Diagnosis not present

## 2017-03-19 DIAGNOSIS — D2262 Melanocytic nevi of left upper limb, including shoulder: Secondary | ICD-10-CM | POA: Diagnosis not present

## 2017-03-19 DIAGNOSIS — D3612 Benign neoplasm of peripheral nerves and autonomic nervous system, upper limb, including shoulder: Secondary | ICD-10-CM | POA: Diagnosis not present

## 2017-04-05 DIAGNOSIS — H43813 Vitreous degeneration, bilateral: Secondary | ICD-10-CM | POA: Diagnosis not present

## 2017-04-05 DIAGNOSIS — E103293 Type 1 diabetes mellitus with mild nonproliferative diabetic retinopathy without macular edema, bilateral: Secondary | ICD-10-CM | POA: Diagnosis not present

## 2017-04-07 DIAGNOSIS — E221 Hyperprolactinemia: Secondary | ICD-10-CM | POA: Diagnosis not present

## 2017-04-07 DIAGNOSIS — E109 Type 1 diabetes mellitus without complications: Secondary | ICD-10-CM | POA: Diagnosis not present

## 2017-04-07 DIAGNOSIS — E7800 Pure hypercholesterolemia, unspecified: Secondary | ICD-10-CM | POA: Diagnosis not present

## 2017-04-14 DIAGNOSIS — E7800 Pure hypercholesterolemia, unspecified: Secondary | ICD-10-CM | POA: Diagnosis not present

## 2017-04-14 DIAGNOSIS — D443 Neoplasm of uncertain behavior of pituitary gland: Secondary | ICD-10-CM | POA: Diagnosis not present

## 2017-04-14 DIAGNOSIS — E109 Type 1 diabetes mellitus without complications: Secondary | ICD-10-CM | POA: Diagnosis not present

## 2017-04-14 DIAGNOSIS — E221 Hyperprolactinemia: Secondary | ICD-10-CM | POA: Diagnosis not present

## 2017-05-26 ENCOUNTER — Ambulatory Visit (INDEPENDENT_AMBULATORY_CARE_PROVIDER_SITE_OTHER): Payer: BLUE CROSS/BLUE SHIELD | Admitting: Family Medicine

## 2017-05-26 VITALS — BP 100/62 | HR 86 | Temp 98.5°F | Ht 64.0 in | Wt 150.2 lb

## 2017-05-26 DIAGNOSIS — J209 Acute bronchitis, unspecified: Secondary | ICD-10-CM | POA: Diagnosis not present

## 2017-05-26 DIAGNOSIS — R05 Cough: Secondary | ICD-10-CM | POA: Diagnosis not present

## 2017-05-26 DIAGNOSIS — R059 Cough, unspecified: Secondary | ICD-10-CM

## 2017-05-26 MED ORDER — HYDROCODONE-HOMATROPINE 5-1.5 MG/5ML PO SYRP: 5.0000 mL | ORAL_SOLUTION | Freq: Three times a day (TID) | ORAL | 0 refills | Status: DC | PRN

## 2017-05-26 MED ORDER — AZITHROMYCIN 250 MG PO TABS: ORAL_TABLET | ORAL | 0 refills | Status: DC

## 2017-05-26 NOTE — Patient Instructions (Signed)
You likely have bronchitis Given the duration of your cough we are going to treat you with an antibiotic - azithromycin I also gave you an rx for hycodan cough syrup which you can use as needed- however this medication can make you sleepy so do not drive while you are taking this   Please let me know if you are not feeling better in the next few days- Sooner if worse.   It may take a few more weeks for the cough to resolve totally however

## 2017-05-26 NOTE — Progress Notes (Signed)
[facility] [address], [place], [place] [address] [phone]  Date:  05/26/2017   Name:  Maria White   DOB:  [birth year]   MRN:  [id number]  PCP:  Patient, No Pcp Per    Chief Complaint: Cough (c/o cough over 2 weeks. )   History of Present Illness:  Maria White is a 51 y.o. very pleasant female patient who presents with the following:  Not a new patient but we have not seen her in over 3 years Here today with concern of cough  She sees her GYN and ophthalmologist She sees Dr. Chalmers Cater every 6 months She does not use a pump, she has constant glucose monitoring   She has noted cough for nearly 3 weeks She cannot sleep well at night due to cough She may cough up some mucus at time She does not have a fever or chills and has felt pretty ok except for some fatigue No ST or earache now but she did have his at the beginning of illness She did have some PND She did have some cervical LAD- saw the NP when she was at [employer] who treated her with amoxicillin for ST and LAD.  She took the amox for 10 days, finished about a week ago No nausea, vomiting or diarrhea  She will have cough attacks and some nausea from drainage She has tried all sorts of OTC medications for the cough- none have really helped all that well  Her husband had a little hycodan left over and she took the last little bit that they had at home   Patient Active Problem List   Diagnosis Date Noted  . Sinusitis 10/30/2013  . Post-viral cough syndrome 07/05/2013  . Tennis elbow 02/03/2011  . Fibrocystic breast 02/03/2011  . DM w/o complication type I ([place]) 01/26/2011  . General medical examination 01/26/2011    Past Medical History:  Diagnosis Date  . Anemia   . Diabetes mellitus    age 71  . Elevated prolactin level ([place])   . Pituitary adenoma ([place]) 9/11   5 mm    Past Surgical History:  Procedure Laterality Date  . COLPOSCOPY     . ENDOMETRIAL ABLATION  8/11   her option  . LASER ABLATION OF THE CERVIX      Social History  Substance Use Topics  . Smoking status: Never Smoker  . Smokeless tobacco: Never Used  . Alcohol use No    Family History  Problem Relation Age of Onset  . Breast cancer Maternal Grandmother 94       and great grandmother  . Hypertension Mother   . Breast cancer Mother 48  . Breast cancer Other     No Known Allergies  Medication list has been reviewed and updated.  Current Outpatient Prescriptions on File Prior to Visit  Medication Sig Dispense Refill  . glucose blood (ONE TOUCH ULTRA TEST) test strip Use as instructed test 8 to 10 times a day 700 each 3  . insulin glargine (LANTUS) 100 UNIT/ML injection Inject 10 Units into the skin 2 (two) times daily. (Patient taking differently: Inject 15 Units into the skin 2 (two) times daily. ) 30 mL 3  . insulin lispro (HUMALOG) 100 UNIT/ML injection Sliding scale 3 mL 3  . Insulin Syringe-Needle U-100 (B-D INS SYR ULTRAFINE .5CC/30G) 30G X 1/2" 0.5 ML MISC Use as directed to administer insulin 600 each 3  No current facility-administered medications on file prior to visit.     Review of Systems:  As per HPI- otherwise negative. BP Readings from Last 3 Encounters:  05/26/17 100/62  11/16/16 122/86  08/30/15 110/78   No fever No rash ST and earache have resolved   Physical Examination: Vitals:   05/26/17 1133  BP: 100/62  Pulse: 86  Temp: 98.5 F (36.9 C)  SpO2: 98%   Vitals:   05/26/17 1133  Weight: 150 lb 3.2 oz (68.1 kg)  Height: 5\' 4"  (1.626 m)   Body mass index is 25.78 kg/m. Ideal Body Weight: Weight in (lb) to have BMI = 25: 145.3  GEN: WDWN, NAD, Non-toxic, A & O x 3, normal weight, looks well HEENT: Atraumatic, Normocephalic. Neck supple. No masses, No LAD. Bilateral TM wnl, oropharynx normal.  PEERL,EOMI.   Coughing in room today  Ears and Nose: No external deformity. CV: RRR, No M/G/R. No JVD. No  thrill. No extra heart sounds. PULM: CTA B, no wheezes, crackles, rhonchi. No retractions. No resp. distress. No accessory muscle use. ABD: S, NT, ND. No rebound. No HSM. EXTR: No c/c/e NEURO Normal gait.  PSYCH: Normally interactive. Conversant. Not depressed or anxious appearing.  Calm demeanor.    Assessment and Plan: Acute bronchitis, unspecified organism - Plan: azithromycin (ZITHROMAX) 250 MG tablet  Cough - Plan: HYDROcodone-homatropine (HYCODAN) 5-1.5 MG/5ML syrup  Persistent cough for nearly 3 weeks Will treat with azithromycin - she was given amox by NP at her job wil treat with zpack as the cough has not improved- encouraged a probiotic  Cough syrup prn  Signed Lamar Blinks, MD

## 2017-08-20 ENCOUNTER — Other Ambulatory Visit: Payer: Self-pay | Admitting: Women's Health

## 2017-08-20 DIAGNOSIS — Z1231 Encounter for screening mammogram for malignant neoplasm of breast: Secondary | ICD-10-CM

## 2017-09-16 ENCOUNTER — Ambulatory Visit: Payer: No Typology Code available for payment source

## 2017-09-20 ENCOUNTER — Encounter: Payer: Self-pay | Admitting: Women's Health

## 2017-09-20 ENCOUNTER — Ambulatory Visit
Admission: RE | Admit: 2017-09-20 | Discharge: 2017-09-20 | Disposition: A | Discharge status: Home / Self Care | Payer: BLUE CROSS/BLUE SHIELD | Source: Ambulatory Visit | Attending: Women's Health | Admitting: Women's Health

## 2017-09-20 DIAGNOSIS — Z1231 Encounter for screening mammogram for malignant neoplasm of breast: Secondary | ICD-10-CM

## 2017-10-05 DIAGNOSIS — E7800 Pure hypercholesterolemia, unspecified: Secondary | ICD-10-CM | POA: Diagnosis not present

## 2017-10-05 DIAGNOSIS — E02 Subclinical iodine-deficiency hypothyroidism: Secondary | ICD-10-CM | POA: Diagnosis not present

## 2017-10-05 DIAGNOSIS — E221 Hyperprolactinemia: Secondary | ICD-10-CM | POA: Diagnosis not present

## 2017-10-05 DIAGNOSIS — E109 Type 1 diabetes mellitus without complications: Secondary | ICD-10-CM | POA: Diagnosis not present

## 2017-11-06 DIAGNOSIS — J029 Acute pharyngitis, unspecified: Secondary | ICD-10-CM | POA: Diagnosis not present

## 2017-11-06 DIAGNOSIS — J301 Allergic rhinitis due to pollen: Secondary | ICD-10-CM | POA: Diagnosis not present

## 2017-11-08 ENCOUNTER — Encounter: Payer: Self-pay | Admitting: Family Medicine

## 2017-11-08 ENCOUNTER — Ambulatory Visit (INDEPENDENT_AMBULATORY_CARE_PROVIDER_SITE_OTHER): Payer: BLUE CROSS/BLUE SHIELD | Admitting: Family Medicine

## 2017-11-08 VITALS — BP 120/72 | HR 83 | Temp 98.2°F

## 2017-11-08 DIAGNOSIS — R059 Cough, unspecified: Secondary | ICD-10-CM

## 2017-11-08 DIAGNOSIS — E7800 Pure hypercholesterolemia, unspecified: Secondary | ICD-10-CM | POA: Diagnosis not present

## 2017-11-08 DIAGNOSIS — R05 Cough: Secondary | ICD-10-CM

## 2017-11-08 DIAGNOSIS — R0982 Postnasal drip: Secondary | ICD-10-CM | POA: Diagnosis not present

## 2017-11-08 DIAGNOSIS — E221 Hyperprolactinemia: Secondary | ICD-10-CM | POA: Diagnosis not present

## 2017-11-08 DIAGNOSIS — D443 Neoplasm of uncertain behavior of pituitary gland: Secondary | ICD-10-CM | POA: Diagnosis not present

## 2017-11-08 DIAGNOSIS — E109 Type 1 diabetes mellitus without complications: Secondary | ICD-10-CM | POA: Diagnosis not present

## 2017-11-08 MED ORDER — HYDROCODONE-HOMATROPINE 5-1.5 MG/5ML PO SYRP: 5.0000 mL | ORAL_SOLUTION | Freq: Three times a day (TID) | ORAL | 0 refills | Status: DC | PRN

## 2017-11-08 MED ORDER — IPRATROPIUM BROMIDE 0.03 % NA SOLN: 2.0000 | Freq: Four times a day (QID) | NASAL | 6 refills | Status: DC

## 2017-11-08 NOTE — Progress Notes (Signed)
[facility] [address], [place], [place] [address] [phone]  Date:  11/08/2017   Name:  Maria White   DOB:  1966-06-15   MRN:  [id number]  PCP:  Patient, No Pcp Per    Chief Complaint: Cough   History of Present Illness:  Maria White is a 52 y.o. very pleasant female patient who presents with the following:  Seen by myself in October of last year with a cough for several weeks, treated with azithromycin and hycodan Here today with concern of recurrent illness-   History of DM1 She sees her GYN and ophthalmologist She sees Dr. Chalmers Cater every 6 months She does not use a pump, she has constant glucose monitoring   Last week- about 5 days ago - she notes onset of ST, "like glass" Then she started to notice some PND, esp at night Went to UC this past Saturday- was given a cough syrup with sudafed that kep her up, a steroid pack, and also some tesslon perles. However the cough is keeping her up She continues to have  No fever, she feels ok except she just cannot sleep Her throat is better No GI symptoms- she did nearly have post- tussive emesis a couple of times No glaucoma   NCCSR: only the cough syrup I gave her last year  Patient Active Problem List   Diagnosis Date Noted  . Sinusitis 10/30/2013  . Post-viral cough syndrome 07/05/2013  . Tennis elbow 02/03/2011  . Fibrocystic breast 02/03/2011  . DM w/o complication type I ([place]) 01/26/2011  . General medical examination 01/26/2011    Past Medical History:  Diagnosis Date  . Anemia   . Diabetes mellitus    age 74  . Elevated prolactin level ([place])   . Pituitary adenoma ([place]) 9/11   5 mm    Past Surgical History:  Procedure Laterality Date  . COLPOSCOPY    . ENDOMETRIAL ABLATION  8/11   her option  . LASER ABLATION OF THE CERVIX      Social History   Tobacco Use  . Smoking status: Never Smoker  . Smokeless tobacco: Never Used   Substance Use Topics  . Alcohol use: No  . Drug use: No    Family History  Problem Relation Age of Onset  . Breast cancer Maternal Grandmother 35       and great grandmother  . Hypertension Mother   . Breast cancer Mother 58  . Breast cancer Other     No Known Allergies  Medication list has been reviewed and updated.  Current Outpatient Medications on File Prior to Visit  Medication Sig Dispense Refill  . azithromycin (ZITHROMAX) 250 MG tablet Use as a zpack 6 tablet 0  . glucose blood (ONE TOUCH ULTRA TEST) test strip Use as instructed test 8 to 10 times a day 700 each 3  . insulin glargine (LANTUS) 100 UNIT/ML injection Inject 10 Units into the skin 2 (two) times daily. (Patient taking differently: Inject 15 Units into the skin 2 (two) times daily. ) 30 mL 3  . insulin lispro (HUMALOG) 100 UNIT/ML injection Sliding scale 3 mL 3  . Insulin Syringe-Needle U-100 (B-D INS SYR ULTRAFINE .5CC/30G) 30G X 1/2" 0.5 ML MISC Use as directed to administer insulin 600 each 3   No current facility-administered medications on file prior to visit.     Review of Systems:  As per HPI- otherwise negative.  Physical Examination: Vitals:   11/08/17 1133  BP: 120/72  Pulse: 83  Temp: 98.2 F (36.8 C)  SpO2: 99%   There were no vitals filed for this visit. There is no height or weight on file to calculate BMI. Ideal Body Weight:    GEN: WDWN, NAD, Non-toxic, A & O x 3, looks well, normal weight HEENT: Atraumatic, Normocephalic. Neck supple. No masses, No LAD. Bilateral TM wnl, oropharynx normal.  PEERL,EOMI.   Ears and Nose: No external deformity. CV: RRR, No M/G/R. No JVD. No thrill. No extra heart sounds. PULM: CTA B, no wheezes, crackles, rhonchi. No retractions. No resp. distress. No accessory muscle use. ABD: S, NT, ND, +BS. No rebound. No HSM. EXTR: No c/c/e NEURO Normal gait.  PSYCH: Normally interactive. Conversant. Not depressed or anxious appearing.  Calm demeanor.     Assessment and Plan: Post-nasal drip - Plan: ipratropium (ATROVENT) 0.03 % nasal spray  Cough - Plan: HYDROcodone-homatropine (HYCODAN) 5-1.5 MG/5ML syrup  Treat for cough and PND as above Cautioned regarding sedation with hycodan She will alert me if not better by the end of the week- in that case consider abx  Signed Lamar Blinks, MD

## 2017-11-08 NOTE — Patient Instructions (Signed)
Good to see you today- I hope that you feel better soon Let me know if not improving by the end of the week- Sooner if worse.  Use the cough syrup as needed but remember it will make you sleepy! Use the nasal spray as needed, up to 4x a day for drainage

## 2017-11-10 ENCOUNTER — Telehealth: Payer: Self-pay | Admitting: Family Medicine

## 2017-11-10 NOTE — Telephone Encounter (Signed)
Copied from [place] [id number]. Topic: Quick Communication - See Telephone Encounter >> Nov 10, 2017  3:52 PM Percell Belt A wrote: CRM for notification. See Telephone encounter for: 11/10/17. Pt was seen on Monday.  She is now feeling worse.  Face pain, yellow mucus and still coughing.  She feel like she may now have a sinus inf?    Pharmacy - [facility] on file. Verified  Best number for pt [phone]

## 2017-11-11 ENCOUNTER — Ambulatory Visit (INDEPENDENT_AMBULATORY_CARE_PROVIDER_SITE_OTHER): Payer: BLUE CROSS/BLUE SHIELD | Admitting: Internal Medicine

## 2017-11-11 ENCOUNTER — Encounter: Payer: Self-pay | Admitting: Family Medicine

## 2017-11-11 ENCOUNTER — Encounter: Payer: Self-pay | Admitting: Internal Medicine

## 2017-11-11 DIAGNOSIS — R05 Cough: Secondary | ICD-10-CM | POA: Diagnosis not present

## 2017-11-11 DIAGNOSIS — R059 Cough, unspecified: Secondary | ICD-10-CM

## 2017-11-11 MED ORDER — PROMETHAZINE-DM 6.25-15 MG/5ML PO SYRP: 5.0000 mL | ORAL_SOLUTION | Freq: Four times a day (QID) | ORAL | 0 refills | Status: DC | PRN

## 2017-11-11 MED ORDER — DOXYCYCLINE HYCLATE 100 MG PO TABS
100.0000 mg | ORAL_TABLET | Freq: Two times a day (BID) | ORAL | 0 refills | Status: DC
Start: 2017-11-11 — End: 2017-11-17

## 2017-11-11 NOTE — Patient Instructions (Signed)
We have sent in doxycycline to take 1 pill twice a day for 1 week.  We have sent in another cough medicine promethazine liquid to use up to 3 times per day as needed. It can make you drowsy so see how it will affect you before driving.

## 2017-11-11 NOTE — Assessment & Plan Note (Signed)
Rx for doxycycline given time course. Rx for phenergan DM cough medicine to help since hycodan has not been effective. Call back PCP if no improvement. Informed that the cough could last for 2-3 more weeks after antibiotic completed.

## 2017-11-11 NOTE — Progress Notes (Signed)
   Subjective:    Patient ID: Maria White, female    DOB: 1965/12/25, 52 y.o.   MRN: [id number]  HPI The patient is a 52 YO female coming in for cough and cold symptoms. Going on for about 1-2 weeks now. Has seen urgent care, then PCP then [place]. She feels that it is not improving at all. Her most concerning symptom to her is the coughing. She cannot stop even at night time and has not slept in 3 days. She is using hydrocodone cough medicine and normally this knocks out her cough but has not helped much. Having some sinus pressure and drainage. Overall this is stable. She is not taking anything other than nose spray ipratropium. Denies taking other otc meds. Denies fevers or chills. Denies SOB.   Review of Systems  Constitutional: Positive for activity change and appetite change. Negative for chills, fatigue, fever and unexpected weight change.  HENT: Positive for congestion, postnasal drip, rhinorrhea and sinus pressure. Negative for ear discharge, ear pain, sinus pain, sneezing, sore throat, tinnitus, trouble swallowing and voice change.   Eyes: Negative.   Respiratory: Positive for cough. Negative for chest tightness, shortness of breath and wheezing.   Cardiovascular: Negative.   Gastrointestinal: Negative.   Musculoskeletal: Positive for myalgias.  Neurological: Negative.       Objective:   Physical Exam  Constitutional: She is oriented to person, place, and time. She appears well-developed and well-nourished.  HENT:  Head: Normocephalic and atraumatic.  Oropharynx with redness and clear drainage, nose with swollen turbinates, TMs normal bilaterally  Eyes: EOM are normal.  Neck: Normal range of motion. No thyromegaly present.  Cardiovascular: Normal rate and regular rhythm.  Pulmonary/Chest: Effort normal and breath sounds normal. No respiratory distress. She has no wheezes. She has no rales.  Abdominal: Soft.  Lymphadenopathy:    She has no cervical adenopathy.  Neurological: She  is alert and oriented to person, place, and time.  Skin: Skin is warm and dry.   Vitals:   11/11/17 1504  BP: 102/90  Pulse: 72  Temp: 99 F (37.2 C)  TempSrc: Oral  SpO2: 98%  Weight: 150 lb (68 kg)  Height: 5\' 4"  (1.626 m)      Assessment & Plan:

## 2017-11-11 NOTE — Telephone Encounter (Signed)
Patient made an appt with Jane Lew Elam today at 3:00pm. Patient was very concerned that she might have Pneumonia.

## 2017-11-17 ENCOUNTER — Ambulatory Visit (INDEPENDENT_AMBULATORY_CARE_PROVIDER_SITE_OTHER): Payer: BLUE CROSS/BLUE SHIELD | Admitting: Medical

## 2017-11-17 ENCOUNTER — Ambulatory Visit (HOSPITAL_BASED_OUTPATIENT_CLINIC_OR_DEPARTMENT_OTHER)
Admission: RE | Admit: 2017-11-17 | Discharge: 2017-11-17 | Disposition: A | Discharge status: Home / Self Care | Payer: BLUE CROSS/BLUE SHIELD | Source: Ambulatory Visit | Attending: Medical | Admitting: Medical

## 2017-11-17 ENCOUNTER — Encounter: Payer: Self-pay | Admitting: Medical

## 2017-11-17 VITALS — BP 105/77 | HR 83 | Temp 98.8°F | Resp 16 | Ht 64.0 in | Wt 147.8 lb

## 2017-11-17 DIAGNOSIS — J40 Bronchitis, not specified as acute or chronic: Secondary | ICD-10-CM

## 2017-11-17 DIAGNOSIS — R0982 Postnasal drip: Secondary | ICD-10-CM

## 2017-11-17 DIAGNOSIS — R0600 Dyspnea, unspecified: Secondary | ICD-10-CM

## 2017-11-17 DIAGNOSIS — R05 Cough: Secondary | ICD-10-CM | POA: Insufficient documentation

## 2017-11-17 DIAGNOSIS — R059 Cough, unspecified: Secondary | ICD-10-CM

## 2017-11-17 MED ORDER — HYDROCOD POLST-CPM POLST ER 10-8 MG/5ML PO SUER: 5.0000 mL | Freq: Two times a day (BID) | ORAL | 0 refills | Status: DC | PRN

## 2017-11-17 MED ORDER — ALBUTEROL SULFATE HFA 108 (90 BASE) MCG/ACT IN AERS: 2.0000 | INHALATION_SPRAY | Freq: Four times a day (QID) | RESPIRATORY_TRACT | 2 refills | Status: DC | PRN

## 2017-11-17 MED ORDER — AZITHROMYCIN 250 MG PO TABS: ORAL_TABLET | ORAL | 0 refills | Status: DC

## 2017-11-17 MED ORDER — FLUCONAZOLE 150 MG PO TABS: ORAL_TABLET | ORAL | 0 refills | Status: DC

## 2017-11-17 MED ORDER — LEVOCETIRIZINE DIHYDROCHLORIDE 5 MG PO TABS: 5.0000 mg | ORAL_TABLET | Freq: Every evening | ORAL | 0 refills | Status: DC

## 2017-11-17 MED FILL — HYDROCODONE-CHLORPHENIRAM S: 10-8 | 12 days supply | Qty: 115 | Fill #0

## 2017-11-17 NOTE — Progress Notes (Signed)
Subjective:    Patient ID: Maria White, female    DOB: 23-Oct-1965, 52 y.o.   MRN: [id number]  HPI   Pt in with in with some cough for more than two weeks. Pt states cough moderate to severe. When she inhales deep will cough worse. She states episodes of severe dry hacky cough.  Pt recently states [place] office gave doxycycline but it made her stomach upset. She vomited. No obvious heart burn or reflux recently.   No obvius wheezing heard.   Occasionally she will bring up mucus when she coughs.  Pt diabetic and has seen endo recently. One week ago a1c 5.7. Pt is on lantus and humalog.  Pt saw Dr. Lorelei Pont and she was given ipatropium nasal spray and  Hycodan early in month.   One time in distant past she had very severe hacky cough that responded to neb treatment and inhalers.  Pt also was given prednisone early on for 6-7 days by urgent care but did not help much.   Review of Systems  Constitutional: Negative for chills, fatigue and fever.  HENT: Positive for congestion and postnasal drip. Negative for dental problem, sinus pressure, sinus pain, sneezing and sore throat.   Eyes: Negative for pain, itching and visual disturbance.  Respiratory: Positive for cough. Negative for chest tightness, shortness of breath and wheezing.   Cardiovascular: Negative for chest pain and palpitations.  Gastrointestinal: Negative for abdominal pain.  Skin: Negative for rash.  Hematological: Negative for adenopathy. Does not bruise/bleed easily.  Psychiatric/Behavioral: Negative for behavioral problems, confusion, dysphoric mood and suicidal ideas.    Past Medical History:  Diagnosis Date  . Anemia   . Diabetes mellitus    age 39  . Elevated prolactin level ([place])   . Pituitary adenoma ([place]) 9/11   5 mm     Social History   Socioeconomic History  . Marital status: Married    Spouse name: Not on file  . Number of children: Not on file  . Years of education: Not on file  . Highest  education level: Not on file  Occupational History  . Not on file  Social Needs  . Financial resource strain: Not on file  . Food insecurity:    Worry: Not on file    Inability: Not on file  . Transportation needs:    Medical: Not on file    Non-medical: Not on file  Tobacco Use  . Smoking status: Never Smoker  . Smokeless tobacco: Never Used  Substance and Sexual Activity  . Alcohol use: No  . Drug use: No  . Sexual activity: Yes    Birth control/protection: Other-see comments    Comment: Vasectomy  Lifestyle  . Physical activity:    Days per week: Not on file    Minutes per session: Not on file  . Stress: Not on file  Relationships  . Social connections:    Talks on phone: Not on file    Gets together: Not on file    Attends religious service: Not on file    Active member of club or organization: Not on file    Attends meetings of clubs or organizations: Not on file    Relationship status: Not on file  . Intimate partner violence:    Fear of current or ex partner: Not on file    Emotionally abused: Not on file    Physically abused: Not on file    Forced sexual activity: Not on file  Other Topics  Concern  . Not on file  Social History Narrative  . Not on file    Past Surgical History:  Procedure Laterality Date  . COLPOSCOPY    . ENDOMETRIAL ABLATION  8/11   her option  . LASER ABLATION OF THE CERVIX      Family History  Problem Relation Age of Onset  . Breast cancer Maternal Grandmother 52       and great grandmother  . Hypertension Mother   . Breast cancer Mother 4  . Breast cancer Other     No Known Allergies  Current Outpatient Medications on File Prior to Visit  Medication Sig Dispense Refill  . glucose blood (ONE TOUCH ULTRA TEST) test strip Use as instructed test 8 to 10 times a day 700 each 3  . insulin glargine (LANTUS) 100 UNIT/ML injection Inject 10 Units into the skin 2 (two) times daily. (Patient taking differently: Inject 15 Units into  the skin 2 (two) times daily. ) 30 mL 3  . insulin lispro (HUMALOG) 100 UNIT/ML injection Sliding scale 3 mL 3  . Insulin Syringe-Needle U-100 (B-D INS SYR ULTRAFINE .5CC/30G) 30G X 1/2" 0.5 ML MISC Use as directed to administer insulin 600 each 3  . ipratropium (ATROVENT) 0.03 % nasal spray Place 2 sprays into the nose 4 (four) times daily. 30 mL 6  . promethazine-dextromethorphan (PROMETHAZINE-DM) 6.25-15 MG/5ML syrup Take 5 mLs by mouth 4 (four) times daily as needed for cough. 100 mL 0   No current facility-administered medications on file prior to visit.     BP 105/77 (BP Location: Right Arm, Patient Position: Sitting, Cuff Size: Normal)   Pulse 83   Temp 98.8 F (37.1 C) (Oral)   Resp 16   Ht 5\' 4"  (1.626 m)   Wt 147 lb 12.8 oz (67 kg)   SpO2 100%   BMI 25.37 kg/m       Objective:   Physical Exam  General  Mental Status - Alert. General Appearance - Well groomed. Not in acute distress.  Skin Rashes- No Rashes.  HEENT Head- Normal. Ear Auditory Canal - Left- Normal. Right - Normal.Tympanic Membrane- Left- Normal. Right- Normal. Eye Sclera/Conjunctiva- Left- Normal. Right- Normal. Nose & Sinuses Nasal Mucosa- Left-  Boggy and Congested. Right-  Boggy and  Congested.Bilateral no maxillary and no  frontal sinus pressure. Mouth & Throat Lips: Upper Lip- Normal: no dryness, cracking, pallor, cyanosis, or vesicular eruption. Lower Lip-Normal: no dryness, cracking, pallor, cyanosis or vesicular eruption. Buccal Mucosa- Bilateral- No Aphthous ulcers. Oropharynx- No Discharge or Erythema. +pnd  Tonsils: Characteristics- Bilateral- No Erythema or Congestion. Size/Enlargement- Bilateral- No enlargement. Discharge- bilateral-None.  Neck Neck- Supple. No Masses.   Chest and Lung Exam Auscultation: Breath Sounds:-Clear even and unlabored.  Cardiovascular Auscultation:Rythm- Regular, rate and rhythm. Murmurs & Other Heart Sounds:Ausculatation of the heart reveal- No  Murmurs.  Lymphatic Head & Neck General Head & Neck Lymphatics: Bilateral: Description- No Localized lymphadenopathy.       Assessment & Plan:  For your constant cough over the past 2 weeks, it appears that you have bronchitis with some possible reactive airways.  I did prescribe a azithromycin antibiotic.  Also making albuterol inhaler available to use if needed.  Would recommend that you try albuterol if you use Tussionex and feel like it did not impact her cough much.  Use albuterol as well if you have wheezing.  Please get chest x-ray today.  Note Xyzal prescribed as well in the event that you have  allergic component to cough.  Update me by Friday morning as to how you are doing.  I considered giving you tapered prednisone today but you did inform me that you had taper prednisone early during the course of this illness and it did not impact your cough much.  Follow-up in 7 days or as needed.

## 2017-11-17 NOTE — Patient Instructions (Addendum)
For your constant cough over the past 2 weeks, it appears that you have bronchitis with some possible reactive airways.  I did prescribe a azithromycin antibiotic.  Also making albuterol inhaler available to use if needed.  Would recommend that you try albuterol if you use Tussionex and feel like it did not impact her cough much.  Use albuterol as well if you have wheezing.  Please get chest x-ray today.  Note Rx of Xyzal prescribed as well in the event you have allergic component to cough.  Update me by Friday morning as to how you are doing.  I considered giving you tapered prednisone today but you did inform me that you had taper prednisone early during the course of this illness and it did not impact your cough much.  Follow-up in 7 days or as needed.

## 2018-01-10 ENCOUNTER — Telehealth: Payer: Self-pay | Admitting: Family Medicine

## 2018-01-10 NOTE — Telephone Encounter (Signed)
Copied from [place] [id number]. Topic: Inquiry >> Jan 10, 2018  1:31 PM Maria White, NT wrote: Reason for CRM: Patient wanted  to know if a person  had  the measles injection before 1967 will they    need to have a booster , she would like to know if this is true please call her at  [phone]

## 2018-01-11 NOTE — Telephone Encounter (Signed)
Please advise 

## 2018-01-11 NOTE — Telephone Encounter (Signed)
Spoke w/ Pt- informed her of recommendations. Pt will think about it and call insurance to see if MMR is covered or if titer is covered- she will call back if interested.

## 2018-01-11 NOTE — Telephone Encounter (Signed)
Please give her a call back. She is probably immune.  If she wishes to have a booster she can do so, however this is not a routine recommendation unless she is traveling internationally.  If she wants to be sure she is immune I can order a titer for her to have drawn at her convenience

## 2018-02-27 NOTE — Progress Notes (Signed)
[facility] [address] [place], [place] [address] [phone]  Date:  03/03/2018   Name:  Maria White   DOB:  Apr 08, 1966   MRN:  [id number]  PCP:  Darreld Mclean, MD    Chief Complaint: Establish Care (tabori patient, PEC told her she had to establish care, seeing endo)   History of Present Illness:  Maria White is a 52 y.o. very pleasant female patient who presents with the following:  Here today to establish care, but I have already seen her a couple of times.  We will do a CPE for her today  From our visit last fall:  Not a new patient but we have not seen her in over 3 years Here today with concern of cough  She sees her GYN and ophthalmologist She sees Dr. Chalmers Cater every 6 months She does not use a pump, she has constant glucose monitoring   She is a [place] pt.  Her DM is under good control.  She thinks her last A1c was in the 5s  Dx with DM at age 1, 89.   She sees GYN-NP at [place] They do her pap mammo is done at [facility] imaging, done in in February of this year  Her GGM, GM, and mother all had breast cancer.  Her mother's cancer was small and treated with surgery only.  Eye exam; approx 11/08/17 She did have a colnoscopy at age ?47- we think at [facility], I called to request her colonoscopy report for her. Colonoscopy was done early due to some concerns that she was having, but it turned out to be ok per her recollection   Her only medications are her insulins and MVI Dr. Chalmers Cater does check her cholesterol, etc per her report  She really does not have any concerns for [place] today -   Tetanus: she is really not sure, offered to do now but pt declines,  She will be moving her daughter back into her college apt this weekend and does not want a sore arm!   She does try to exercise as much as she can - she will walk on the treadmill at the gym  She is trying to lift weights as well  Her daughters are  31 and 2 yo, both doing well   Patient Active Problem List   Diagnosis Date Noted  . Tennis elbow 02/03/2011  . Fibrocystic breast 02/03/2011  . DM w/o complication type I ([place]) 01/26/2011    Past Medical History:  Diagnosis Date  . Anemia   . Diabetes mellitus    age 50  . Elevated prolactin level ([place])   . Pituitary adenoma ([place]) 9/11   5 mm    Past Surgical History:  Procedure Laterality Date  . COLPOSCOPY    . ENDOMETRIAL ABLATION  8/11   her option  . LASER ABLATION OF THE CERVIX      Social History   Tobacco Use  . Smoking status: Never Smoker  . Smokeless tobacco: Never Used  Substance Use Topics  . Alcohol use: No  . Drug use: No    Family History  Problem Relation Age of Onset  . Breast cancer Maternal Grandmother 46       and great grandmother  . Hypertension Mother   . Breast cancer Mother 14  . Breast cancer Other     No Known Allergies  Medication list has been reviewed and updated.  Current Outpatient Medications on File Prior to Visit  Medication Sig Dispense Refill  . glucose blood (ONE TOUCH ULTRA TEST) test strip Use as instructed test 8 to 10 times a day 700 each 3  . insulin glargine (LANTUS) 100 UNIT/ML injection Inject 10 Units into the skin 2 (two) times daily. (Patient taking differently: Inject 15 Units into the skin 2 (two) times daily. ) 30 mL 3  . insulin lispro (HUMALOG) 100 UNIT/ML injection Sliding scale 3 mL 3  . Insulin Syringe-Needle U-100 (B-D INS SYR ULTRAFINE .5CC/30G) 30G X 1/2" 0.5 ML MISC Use as directed to administer insulin 600 each 3   No current facility-administered medications on file prior to visit.     Review of Systems:  As per HPI- otherwise negative. No fever or chills No CP or SOB    Physical Examination: Vitals:   03/03/18 1305  BP: 116/76  Pulse: 83  Resp: 16  Temp: 99 F (37.2 C)  SpO2: 99%   Vitals:   03/03/18 1305  Weight: 149 lb 9.6 oz (67.9 kg)  Height: 5\' 4"  (1.626 m)   Body  mass index is 25.68 kg/m. Ideal Body Weight: Weight in (lb) to have BMI = 25: 145.3  GEN: WDWN, NAD, Non-toxic, A & O x 3, looks well, minimal overweight HEENT: Atraumatic, Normocephalic. Neck supple. No masses, No LAD.  Bilateral TM wnl, oropharynx normal.  PEERL,EOMI.   Ears and Nose: No external deformity. CV: RRR, No M/G/R. No JVD. No thrill. No extra heart sounds. PULM: CTA B, no wheezes, crackles, rhonchi. No retractions. No resp. distress. No accessory muscle use. ABD: S, NT, ND EXTR: No c/c/e NEURO Normal gait.  PSYCH: Normally interactive. Conversant. Not depressed or anxious appearing.  Calm demeanor.  Foot exam today   Assessment and Plan: Physical exam  CPE today She is doing well Request colonoscopy report and records from her endocrinologist.  I am not sure what labs she has had, but pt feels that she is UTD   Signed Lamar Blinks, MD

## 2018-03-03 ENCOUNTER — Ambulatory Visit (INDEPENDENT_AMBULATORY_CARE_PROVIDER_SITE_OTHER): Payer: BLUE CROSS/BLUE SHIELD | Admitting: Family Medicine

## 2018-03-03 ENCOUNTER — Encounter: Payer: Self-pay | Admitting: Family Medicine

## 2018-03-03 VITALS — BP 116/76 | HR 83 | Temp 99.0°F | Resp 16 | Ht 64.0 in | Wt 149.6 lb

## 2018-03-03 DIAGNOSIS — Z0000 Encounter for general adult medical examination without abnormal findings: Secondary | ICD-10-CM | POA: Diagnosis not present

## 2018-03-03 NOTE — Patient Instructions (Addendum)
It was very nice to see you again today!  I will request records from Dr. Chalmers Cater for you  Let's plan to visit in about 6 months   Health Maintenance, Female Adopting a healthy lifestyle and getting preventive care can go a long way to promote health and wellness. Talk with your health care provider about what schedule of regular examinations is right for you. This is a good chance for you to check in with your provider about disease prevention and staying healthy. In between checkups, there are plenty of things you can do on your own. Experts have done a lot of research about which lifestyle changes and preventive measures are most likely to keep you healthy. Ask your health care provider for more information. Weight and diet Eat a healthy diet  Be sure to include plenty of vegetables, fruits, low-fat dairy products, and lean protein.  Do not eat a lot of foods high in solid fats, added sugars, or salt.  Get regular exercise. This is one of the most important things you can do for your health. ? Most adults should exercise for at least 150 minutes each week. The exercise should increase your heart rate and make you sweat (moderate-intensity exercise). ? Most adults should also do strengthening exercises at least twice a week. This is in addition to the moderate-intensity exercise.  Maintain a healthy weight  Body mass index (BMI) is a measurement that can be used to identify possible weight problems. It estimates body fat based on height and weight. Your health care provider can help determine your BMI and help you achieve or maintain a healthy weight.  For females 20 years of age and older: ? A BMI below 18.5 is considered underweight. ? A BMI of 18.5 to 24.9 is normal. ? A BMI of 25 to 29.9 is considered overweight. ? A BMI of 30 and above is considered obese.  Watch levels of cholesterol and blood lipids  You should start having your blood tested for lipids and cholesterol at 53 years  of age, then have this test every 5 years.  You may need to have your cholesterol levels checked more often if: ? Your lipid or cholesterol levels are high. ? You are older than 53 years of age. ? You are at high risk for heart disease.  Cancer screening Lung Cancer  Lung cancer screening is recommended for adults 65-38 years old who are at high risk for lung cancer because of a history of smoking.  A yearly low-dose CT scan of the lungs is recommended for people who: ? Currently smoke. ? Have quit within the past 15 years. ? Have at least a 30-pack-year history of smoking. A pack year is smoking an average of one pack of cigarettes a day for 1 year.  Yearly screening should continue until it has been 15 years since you quit.  Yearly screening should stop if you develop a health problem that would prevent you from having lung cancer treatment.  Breast Cancer  Practice breast self-awareness. This means understanding how your breasts normally appear and feel.  It also means doing regular breast self-exams. Let your health care provider know about any changes, no matter how small.  If you are in your 20s or 30s, you should have a clinical breast exam (CBE) by a health care provider every 1-3 years as part of a regular health exam.  If you are 62 or older, have a CBE every year. Also consider having a breast  X-ray (mammogram) every year.  If you have a family history of breast cancer, talk to your health care provider about genetic screening.  If you are at high risk for breast cancer, talk to your health care provider about having an MRI and a mammogram every year.  Breast cancer gene (BRCA) assessment is recommended for women who have family members with BRCA-related cancers. BRCA-related cancers include: ? Breast. ? Ovarian. ? Tubal. ? Peritoneal cancers.  Results of the assessment will determine the need for genetic counseling and BRCA1 and BRCA2 testing.  Cervical  Cancer Your health care provider may recommend that you be screened regularly for cancer of the pelvic organs (ovaries, uterus, and vagina). This screening involves a pelvic examination, including checking for microscopic changes to the surface of your cervix (Pap test). You may be encouraged to have this screening done every 3 years, beginning at age 78.  For women ages 57-65, health care providers may recommend pelvic exams and Pap testing every 3 years, or they may recommend the Pap and pelvic exam, combined with testing for human papilloma virus (HPV), every 5 years. Some types of HPV increase your risk of cervical cancer. Testing for HPV may also be done on women of any age with unclear Pap test results.  Other health care providers may not recommend any screening for nonpregnant women who are considered low risk for pelvic cancer and who do not have symptoms. Ask your health care provider if a screening pelvic exam is right for you.  If you have had past treatment for cervical cancer or a condition that could lead to cancer, you need Pap tests and screening for cancer for at least 20 years after your treatment. If Pap tests have been discontinued, your risk factors (such as having a new sexual partner) need to be reassessed to determine if screening should resume. Some women have medical problems that increase the chance of getting cervical cancer. In these cases, your health care provider may recommend more frequent screening and Pap tests.  Colorectal Cancer  This type of cancer can be detected and often prevented.  Routine colorectal cancer screening usually begins at 53 years of age and continues through 53 years of age.  Your health care provider may recommend screening at an earlier age if you have risk factors for colon cancer.  Your health care provider may also recommend using home test kits to check for hidden blood in the stool.  A small camera at the end of a tube can be used to  examine your colon directly (sigmoidoscopy or colonoscopy). This is done to check for the earliest forms of colorectal cancer.  Routine screening usually begins at age 36.  Direct examination of the colon should be repeated every 5-10 years through 53 years of age. However, you may need to be screened more often if early forms of precancerous polyps or small growths are found.  Skin Cancer  Check your skin from head to toe regularly.  Tell your health care provider about any new moles or changes in moles, especially if there is a change in a mole's shape or color.  Also tell your health care provider if you have a mole that is larger than the size of a pencil eraser.  Always use sunscreen. Apply sunscreen liberally and repeatedly throughout the day.  Protect yourself by wearing long sleeves, pants, a wide-brimmed hat, and sunglasses whenever you are outside.  Heart disease, diabetes, and high blood pressure  High blood  pressure causes heart disease and increases the risk of stroke. High blood pressure is more likely to develop in: ? People who have blood pressure in the high end of the normal range (130-139/85-89 mm Hg). ? People who are overweight or obese. ? People who are African American.  If you are 73-13 years of age, have your blood pressure checked every 3-5 years. If you are 25 years of age or older, have your blood pressure checked every year. You should have your blood pressure measured twice-once when you are at a hospital or clinic, and once when you are not at a hospital or clinic. Record the average of the two measurements. To check your blood pressure when you are not at a hospital or clinic, you can use: ? An automated blood pressure machine at a pharmacy. ? A home blood pressure monitor.  If you are between 32 years and 64 years old, ask your health care provider if you should take aspirin to prevent strokes.  Have regular diabetes screenings. This involves taking a  blood sample to check your fasting blood sugar level. ? If you are at a normal weight and have a low risk for diabetes, have this test once every three years after 53 years of age. ? If you are overweight and have a high risk for diabetes, consider being tested at a younger age or more often. Preventing infection Hepatitis B  If you have a higher risk for hepatitis B, you should be screened for this virus. You are considered at high risk for hepatitis B if: ? You were born in a country where hepatitis B is common. Ask your health care provider which countries are considered high risk. ? Your parents were born in a high-risk country, and you have not been immunized against hepatitis B (hepatitis B vaccine). ? You have HIV or AIDS. ? You use needles to inject street drugs. ? You live with someone who has hepatitis B. ? You have had sex with someone who has hepatitis B. ? You get hemodialysis treatment. ? You take certain medicines for conditions, including cancer, organ transplantation, and autoimmune conditions.  Hepatitis C  Blood testing is recommended for: ? Everyone born from [birth year] through 1966. ? Anyone with known risk factors for hepatitis C.  Sexually transmitted infections (STIs)  You should be screened for sexually transmitted infections (STIs) including gonorrhea and chlamydia if: ? You are sexually active and are younger than 53 years of age. ? You are older than 53 years of age and your health care provider tells you that you are at risk for this type of infection. ? Your sexual activity has changed since you were last screened and you are at an increased risk for chlamydia or gonorrhea. Ask your health care provider if you are at risk.  If you do not have HIV, but are at risk, it may be recommended that you take a prescription medicine daily to prevent HIV infection. This is called pre-exposure prophylaxis (PrEP). You are considered at risk if: ? You are sexually active and  do not regularly use condoms or know the HIV status of your partner(s). ? You take drugs by injection. ? You are sexually active with a partner who has HIV.  Talk with your health care provider about whether you are at high risk of being infected with HIV. If you choose to begin PrEP, you should first be tested for HIV. You should then be tested every 3 months for as  long as you are taking PrEP. Pregnancy  If you are premenopausal and you may become pregnant, ask your health care provider about preconception counseling.  If you may become pregnant, take 400 to 800 micrograms (mcg) of folic acid every day.  If you want to prevent pregnancy, talk to your health care provider about birth control (contraception). Osteoporosis and menopause  Osteoporosis is a disease in which the bones lose minerals and strength with aging. This can result in serious bone fractures. Your risk for osteoporosis can be identified using a bone density scan.  If you are 59 years of age or older, or if you are at risk for osteoporosis and fractures, ask your health care provider if you should be screened.  Ask your health care provider whether you should take a calcium or vitamin D supplement to lower your risk for osteoporosis.  Menopause may have certain physical symptoms and risks.  Hormone replacement therapy may reduce some of these symptoms and risks. Talk to your health care provider about whether hormone replacement therapy is right for you. Follow these instructions at home:  Schedule regular health, dental, and eye exams.  Stay current with your immunizations.  Do not use any tobacco products including cigarettes, chewing tobacco, or electronic cigarettes.  If you are pregnant, do not drink alcohol.  If you are breastfeeding, limit how much and how often you drink alcohol.  Limit alcohol intake to no more than 1 drink per day for nonpregnant women. One drink equals 12 ounces of beer, 5 ounces of  wine, or 1 ounces of hard liquor.  Do not use street drugs.  Do not share needles.  Ask your health care provider for help if you need support or information about quitting drugs.  Tell your health care provider if you often feel depressed.  Tell your health care provider if you have ever been abused or do not feel safe at home. This information is not intended to replace advice given to you by your health care provider. Make sure you discuss any questions you have with your health care provider. Document Released: 01/26/2011 Document Revised: 12/19/2015 Document Reviewed: 04/16/2015 Elsevier Interactive Patient Education  [employer].

## 2018-04-11 DIAGNOSIS — E103293 Type 1 diabetes mellitus with mild nonproliferative diabetic retinopathy without macular edema, bilateral: Secondary | ICD-10-CM | POA: Diagnosis not present

## 2018-04-11 DIAGNOSIS — H43813 Vitreous degeneration, bilateral: Secondary | ICD-10-CM | POA: Diagnosis not present

## 2018-04-11 DIAGNOSIS — H2513 Age-related nuclear cataract, bilateral: Secondary | ICD-10-CM | POA: Diagnosis not present

## 2018-04-11 DIAGNOSIS — H3561 Retinal hemorrhage, right eye: Secondary | ICD-10-CM | POA: Diagnosis not present

## 2018-05-23 DIAGNOSIS — E221 Hyperprolactinemia: Secondary | ICD-10-CM | POA: Diagnosis not present

## 2018-05-23 DIAGNOSIS — E7800 Pure hypercholesterolemia, unspecified: Secondary | ICD-10-CM | POA: Diagnosis not present

## 2018-05-23 DIAGNOSIS — E02 Subclinical iodine-deficiency hypothyroidism: Secondary | ICD-10-CM | POA: Diagnosis not present

## 2018-05-23 DIAGNOSIS — E109 Type 1 diabetes mellitus without complications: Secondary | ICD-10-CM | POA: Diagnosis not present

## 2018-05-26 DIAGNOSIS — E109 Type 1 diabetes mellitus without complications: Secondary | ICD-10-CM | POA: Diagnosis not present

## 2018-05-26 DIAGNOSIS — E221 Hyperprolactinemia: Secondary | ICD-10-CM | POA: Diagnosis not present

## 2018-05-26 DIAGNOSIS — E7800 Pure hypercholesterolemia, unspecified: Secondary | ICD-10-CM | POA: Diagnosis not present

## 2018-05-26 DIAGNOSIS — D443 Neoplasm of uncertain behavior of pituitary gland: Secondary | ICD-10-CM | POA: Diagnosis not present

## 2018-08-22 ENCOUNTER — Other Ambulatory Visit: Payer: Self-pay | Admitting: Women's Health

## 2018-08-22 DIAGNOSIS — Z1231 Encounter for screening mammogram for malignant neoplasm of breast: Secondary | ICD-10-CM

## 2018-09-21 DIAGNOSIS — J019 Acute sinusitis, unspecified: Secondary | ICD-10-CM | POA: Diagnosis not present

## 2018-09-22 ENCOUNTER — Ambulatory Visit
Admission: RE | Admit: 2018-09-22 | Discharge: 2018-09-22 | Disposition: A | Discharge status: Home / Self Care | Payer: BLUE CROSS/BLUE SHIELD | Source: Ambulatory Visit | Attending: Women's Health | Admitting: Women's Health

## 2018-09-22 DIAGNOSIS — Z1231 Encounter for screening mammogram for malignant neoplasm of breast: Secondary | ICD-10-CM

## 2018-10-12 ENCOUNTER — Ambulatory Visit (INDEPENDENT_AMBULATORY_CARE_PROVIDER_SITE_OTHER): Payer: BLUE CROSS/BLUE SHIELD | Admitting: Women's Health

## 2018-10-12 ENCOUNTER — Encounter: Payer: Self-pay | Admitting: Women's Health

## 2018-10-12 ENCOUNTER — Other Ambulatory Visit: Payer: Self-pay

## 2018-10-12 VITALS — BP 122/80 | Ht 64.0 in | Wt 149.0 lb

## 2018-10-12 DIAGNOSIS — Z01419 Encounter for gynecological examination (general) (routine) without abnormal findings: Secondary | ICD-10-CM

## 2018-10-12 NOTE — Progress Notes (Signed)
Maria White 1965/12/10 [id number]    History:    Presents for annual exam.  Light several day spotting monthly, history of ablation/vasectomy.  Normal Pap and mammogram history.  Type 1 diabetes since age 53 continuous glucose monitor which has helped.  2011- colonoscopy.  States mother was 10 at menopause.  Reports has always had frequent urination but has increased with age.  Past medical history, past surgical history, family history and social history were all reviewed and documented in the EPIC chart.  2 daughters oldest daughter works for the [employer] with [employer], youngest daughter is at [employer] doing well.    ROS:  A ROS was performed and pertinent positives and negatives are included.  Exam:  Vitals:   10/12/18 1457  BP: 122/80  Weight: 149 lb (67.6 kg)  Height: 5\' 4"  (1.626 m)   Body mass index is 25.58 kg/m.   General appearance:  Normal Thyroid:  Symmetrical, normal in size, without palpable masses or nodularity. Respiratory  Auscultation:  Clear without wheezing or rhonchi Cardiovascular  Auscultation:  Regular rate, without rubs, murmurs or gallops  Edema/varicosities:  Not grossly evident Abdominal  Soft,nontender, without masses, guarding or rebound.  Liver/spleen:  No organomegaly noted  Hernia:  None appreciated  Skin  Inspection:  Grossly normal   Breasts: Examined lying and sitting.     Right: Without masses, retractions, discharge or axillary adenopathy.     Left: Without masses, retractions, discharge or axillary adenopathy. Gentitourinary   Inguinal/mons:  Normal without inguinal adenopathy  External genitalia:  Normal  BUS/Urethra/Skene's glands:  Normal  Vagina:  Normal  Cervix:  Normal  Uterus:   normal in size, shape and contour.  Midline and mobile  Adnexa/parametria:     Rt: Without masses or tenderness.   Lt: Without masses or tenderness.  Anus and perineum: Normal  Digital rectal exam: Normal sphincter tone without  palpated masses or tenderness  Assessment/Plan:  53 y.o. MWF G3 P2 for annual exam with complaint of frequent urination without pain or burning.  Light monthly cycle/ablation/vasectomy Type 1 diabetes-endocrinologist manages labs and meds Overactive bladder  Plan: Overactive bladder reviewed, bladder irritants reviewed, possible medications, does not want to try medication at this time.  SBEs, continue annual 3D screening mammogram, calcium rich foods, vitamin D 2000 daily encouraged.  Continue healthy lifestyle of healthy diet and regular exercise.  Pap with HR HPV typing, new screening guidelines reviewed.   [place], 3:52 PM 10/12/2018

## 2018-10-12 NOTE — Patient Instructions (Addendum)
Overactive Bladder, Adult  Overactive bladder refers to a condition in which a person has a sudden need to pass urine. The person may leak urine if he or she cannot get to the bathroom fast enough (urinary incontinence). A person with this condition may also wake up several times in the night to go to the bathroom. Overactive bladder is associated with poor nerve signals between your bladder and your brain. Your bladder may get the signal to empty before it is full. You may also have very sensitive muscles that make your bladder squeeze too soon. These symptoms might interfere with daily work or social activities. What are the causes? This condition may be associated with or caused by:  Urinary tract infection.  Infection of nearby tissues, such as the prostate.  Prostate enlargement.  Surgery on the uterus or urethra.  Bladder stones, inflammation, or tumors.  Drinking too much caffeine or alcohol.  Certain medicines, especially medicines that get rid of extra fluid in the body (diuretics).  Muscle or nerve weakness, especially from: ? A spinal cord injury. ? Stroke. ? Multiple sclerosis. ? Parkinson's disease.  Diabetes.  Constipation. What increases the risk? You may be at greater risk for overactive bladder if you:  Are an older adult.  Smoke.  Are going through menopause.  Have prostate problems.  Have a neurological disease, such as stroke, dementia, Parkinson's disease, or multiple sclerosis (MS).  Eat or drink things that irritate the bladder. These include alcohol, spicy food, and caffeine.  Are overweight or obese. What are the signs or symptoms? Symptoms of this condition include:  Sudden, strong urge to urinate.  Leaking urine.  Urinating 8 or more times a day.  Waking up to urinate 2 or more times a night. How is this diagnosed? Your health care provider may suspect overactive bladder based on your symptoms. He or she will diagnose this condition  by:  A physical exam and medical history.  Blood or urine tests. You might need bladder or urine tests to help determine what is causing your overactive bladder. You might also need to see a health care provider who specializes in urinary tract problems (urologist). How is this treated? Treatment for overactive bladder depends on the cause of your condition and whether it is mild or severe. You can also make lifestyle changes at home. Options include:  Bladder training. This may include: ? Learning to control the urge to urinate by following a schedule that directs you to urinate at regular intervals (timed voiding). ? Doing Kegel exercises to strengthen your pelvic floor muscles, which support your bladder. Toning these muscles can help you control urination, even if your bladder muscles are overactive.  Special devices. This may include: ? Biofeedback, which uses sensors to help you become aware of your body's signals. ? Electrical stimulation, which uses electrodes placed inside the body (implanted) or outside the body. These electrodes send gentle pulses of electricity to strengthen the nerves or muscles that control the bladder. ? Women may use a plastic device that fits into the vagina and supports the bladder (pessary).  Medicines. ? Antibiotics to treat bladder infection. ? Antispasmodics to stop the bladder from releasing urine at the wrong time. ? Tricyclic antidepressants to relax bladder muscles. ? Injections of botulinum toxin type A directly into the bladder tissue to relax bladder muscles.  Lifestyle changes. This may include: ? Weight loss. Talk to your health care provider about weight loss methods that would work best for you. ?   Diet changes. This may include reducing how much alcohol and caffeine you consume, or drinking fluids at different times of the day. ? Not smoking. Do not use any products that contain nicotine or tobacco, such as cigarettes and e-cigarettes. If  you need help quitting, ask your health care provider.  Surgery. ? A device may be implanted to help manage the nerve signals that control urination. ? An electrode may be implanted to stimulate electrical signals in the bladder. ? A procedure may be done to change the shape of the bladder. This is done only in very severe cases. Follow these instructions at home: Lifestyle  Make any diet or lifestyle changes that are recommended by your health care provider. These may include: ? Drinking less fluid or drinking fluids at different times of the day. ? Cutting down on caffeine or alcohol. ? Doing Kegel exercises. ? Losing weight if needed. ? Eating a healthy and balanced diet to prevent constipation. This may include:  Eating foods that are high in fiber, such as fresh fruits and vegetables, whole grains, and beans.  Limiting foods that are high in fat and processed sugars, such as fried and sweet foods. General instructions  Take over-the-counter and prescription medicines only as told by your health care provider.  If you were prescribed an antibiotic medicine, take it as told by your health care provider. Do not stop taking the antibiotic even if you start to feel better.  Use any implants or pessary as told by your health care provider.  If needed, wear pads to absorb urine leakage.  Keep a journal or log to track how much and when you drink and when you feel the need to urinate. This will help your health care provider monitor your condition.  Keep all follow-up visits as told by your health care provider. This is important. Contact a health care provider if:  You have a fever.  Your symptoms do not get better with treatment.  Your pain and discomfort get worse.  You have more frequent urges to urinate. Get help right away if:  You are not able to control your bladder. Summary  Overactive bladder refers to a condition in which a person has a sudden need to pass  urine.  Several conditions may lead to an overactive bladder.  Treatment for overactive bladder depends on the cause and severity of your condition.  Follow your health care provider's instructions about lifestyle changes, doing Kegel exercises, keeping a journal, and taking medicines. This information is not intended to replace advice given to you by your health care provider. Make sure you discuss any questions you have with your health care provider. Document Released: 05/09/2009 Document Revised: 07/29/2017 Document Reviewed: 07/29/2017 Elsevier Interactive Patient Education  2019 [place] Maintenance, Female Adopting a healthy lifestyle and getting preventive care can go a long way to promote health and wellness. Talk with your health care provider about what schedule of regular examinations is right for you. This is a good chance for you to check in with your provider about disease prevention and staying healthy. In between checkups, there are plenty of things you can do on your own. Experts have done a lot of research about which lifestyle changes and preventive measures are most likely to keep you healthy. Ask your health care provider for more information. Weight and diet Eat a healthy diet  Be sure to include plenty of vegetables, fruits, low-fat dairy products, and lean protein.  Do not eat  a lot of foods high in solid fats, added sugars, or salt.  Get regular exercise. This is one of the most important things you can do for your health. ? Most adults should exercise for at least 150 minutes each week. The exercise should increase your heart rate and make you sweat (moderate-intensity exercise). ? Most adults should also do strengthening exercises at least twice a week. This is in addition to the moderate-intensity exercise. Maintain a healthy weight  Body mass index (BMI) is a measurement that can be used to identify possible weight problems. It estimates body fat  based on height and weight. Your health care provider can help determine your BMI and help you achieve or maintain a healthy weight.  For females 33 years of age and older: ? A BMI below 18.5 is considered underweight. ? A BMI of 18.5 to 24.9 is normal. ? A BMI of 25 to 29.9 is considered overweight. ? A BMI of 30 and above is considered obese. Watch levels of cholesterol and blood lipids  You should start having your blood tested for lipids and cholesterol at 54 years of age, then have this test every 5 years.  You may need to have your cholesterol levels checked more often if: ? Your lipid or cholesterol levels are high. ? You are older than 54 years of age. ? You are at high risk for heart disease. Cancer screening Lung Cancer  Lung cancer screening is recommended for adults 56-5 years old who are at high risk for lung cancer because of a history of smoking.  A yearly low-dose CT scan of the lungs is recommended for people who: ? Currently smoke. ? Have quit within the past 15 years. ? Have at least a 30-pack-year history of smoking. A pack year is smoking an average of one pack of cigarettes a day for 1 year.  Yearly screening should continue until it has been 15 years since you quit.  Yearly screening should stop if you develop a health problem that would prevent you from having lung cancer treatment. Breast Cancer  Practice breast self-awareness. This means understanding how your breasts normally appear and feel.  It also means doing regular breast self-exams. Let your health care provider know about any changes, no matter how small.  If you are in your 20s or 30s, you should have a clinical breast exam (CBE) by a health care provider every 1-3 years as part of a regular health exam.  If you are 62 or older, have a CBE every year. Also consider having a breast X-ray (mammogram) every year.  If you have a family history of breast cancer, talk to your health care provider  about genetic screening.  If you are at high risk for breast cancer, talk to your health care provider about having an MRI and a mammogram every year.  Breast cancer gene (BRCA) assessment is recommended for women who have family members with BRCA-related cancers. BRCA-related cancers include: ? Breast. ? Ovarian. ? Tubal. ? Peritoneal cancers.  Results of the assessment will determine the need for genetic counseling and BRCA1 and BRCA2 testing. Cervical Cancer Your health care provider may recommend that you be screened regularly for cancer of the pelvic organs (ovaries, uterus, and vagina). This screening involves a pelvic examination, including checking for microscopic changes to the surface of your cervix (Pap test). You may be encouraged to have this screening done every 3 years, beginning at age 30.  For women ages 67-65, health  care providers may recommend pelvic exams and Pap testing every 3 years, or they may recommend the Pap and pelvic exam, combined with testing for human papilloma virus (HPV), every 5 years. Some types of HPV increase your risk of cervical cancer. Testing for HPV may also be done on women of any age with unclear Pap test results.  Other health care providers may not recommend any screening for nonpregnant women who are considered low risk for pelvic cancer and who do not have symptoms. Ask your health care provider if a screening pelvic exam is right for you.  If you have had past treatment for cervical cancer or a condition that could lead to cancer, you need Pap tests and screening for cancer for at least 20 years after your treatment. If Pap tests have been discontinued, your risk factors (such as having a new sexual partner) need to be reassessed to determine if screening should resume. Some women have medical problems that increase the chance of getting cervical cancer. In these cases, your health care provider may recommend more frequent screening and Pap  tests. Colorectal Cancer  This type of cancer can be detected and often prevented.  Routine colorectal cancer screening usually begins at 54 years of age and continues through 54 years of age.  Your health care provider may recommend screening at an earlier age if you have risk factors for colon cancer.  Your health care provider may also recommend using home test kits to check for hidden blood in the stool.  A small camera at the end of a tube can be used to examine your colon directly (sigmoidoscopy or colonoscopy). This is done to check for the earliest forms of colorectal cancer.  Routine screening usually begins at age 79.  Direct examination of the colon should be repeated every 5-10 years through 54 years of age. However, you may need to be screened more often if early forms of precancerous polyps or small growths are found. Skin Cancer  Check your skin from head to toe regularly.  Tell your health care provider about any new moles or changes in moles, especially if there is a change in a mole's shape or color.  Also tell your health care provider if you have a mole that is larger than the size of a pencil eraser.  Always use sunscreen. Apply sunscreen liberally and repeatedly throughout the day.  Protect yourself by wearing long sleeves, pants, a wide-brimmed hat, and sunglasses whenever you are outside. Heart disease, diabetes, and high blood pressure  High blood pressure causes heart disease and increases the risk of stroke. High blood pressure is more likely to develop in: ? People who have blood pressure in the high end of the normal range (130-139/85-89 mm Hg). ? People who are overweight or obese. ? People who are African American.  If you are 92-32 years of age, have your blood pressure checked every 3-5 years. If you are 25 years of age or older, have your blood pressure checked every year. You should have your blood pressure measured twice-once when you are at a  hospital or clinic, and once when you are not at a hospital or clinic. Record the average of the two measurements. To check your blood pressure when you are not at a hospital or clinic, you can use: ? An automated blood pressure machine at a pharmacy. ? A home blood pressure monitor.  If you are between 16 years and 16 years old, ask your health care  provider if you should take aspirin to prevent strokes.  Have regular diabetes screenings. This involves taking a blood sample to check your fasting blood sugar level. ? If you are at a normal weight and have a low risk for diabetes, have this test once every three years after 54 years of age. ? If you are overweight and have a high risk for diabetes, consider being tested at a younger age or more often. Preventing infection Hepatitis B  If you have a higher risk for hepatitis B, you should be screened for this virus. You are considered at high risk for hepatitis B if: ? You were born in a country where hepatitis B is common. Ask your health care provider which countries are considered high risk. ? Your parents were born in a high-risk country, and you have not been immunized against hepatitis B (hepatitis B vaccine). ? You have HIV or AIDS. ? You use needles to inject street drugs. ? You live with someone who has hepatitis B. ? You have had sex with someone who has hepatitis B. ? You get hemodialysis treatment. ? You take certain medicines for conditions, including cancer, organ transplantation, and autoimmune conditions. Hepatitis C  Blood testing is recommended for: ? Everyone born from [birth year] through 1966. ? Anyone with known risk factors for hepatitis C. Sexually transmitted infections (STIs)  You should be screened for sexually transmitted infections (STIs) including gonorrhea and chlamydia if: ? You are sexually active and are younger than 54 years of age. ? You are older than 54 years of age and your health care provider tells you  that you are at risk for this type of infection. ? Your sexual activity has changed since you were last screened and you are at an increased risk for chlamydia or gonorrhea. Ask your health care provider if you are at risk.  If you do not have HIV, but are at risk, it may be recommended that you take a prescription medicine daily to prevent HIV infection. This is called pre-exposure prophylaxis (PrEP). You are considered at risk if: ? You are sexually active and do not regularly use condoms or know the HIV status of your partner(s). ? You take drugs by injection. ? You are sexually active with a partner who has HIV. Talk with your health care provider about whether you are at high risk of being infected with HIV. If you choose to begin PrEP, you should first be tested for HIV. You should then be tested every 3 months for as long as you are taking PrEP. Pregnancy  If you are premenopausal and you may become pregnant, ask your health care provider about preconception counseling.  If you may become pregnant, take 400 to 800 micrograms (mcg) of folic acid every day.  If you want to prevent pregnancy, talk to your health care provider about birth control (contraception). Osteoporosis and menopause  Osteoporosis is a disease in which the bones lose minerals and strength with aging. This can result in serious bone fractures. Your risk for osteoporosis can be identified using a bone density scan.  If you are 34 years of age or older, or if you are at risk for osteoporosis and fractures, ask your health care provider if you should be screened.  Ask your health care provider whether you should take a calcium or vitamin D supplement to lower your risk for osteoporosis.  Menopause may have certain physical symptoms and risks.  Hormone replacement therapy may reduce some of these  symptoms and risks. Talk to your health care provider about whether hormone replacement therapy is right for you. Follow  these instructions at home:  Schedule regular health, dental, and eye exams.  Stay current with your immunizations.  Do not use any tobacco products including cigarettes, chewing tobacco, or electronic cigarettes.  If you are pregnant, do not drink alcohol.  If you are breastfeeding, limit how much and how often you drink alcohol.  Limit alcohol intake to no more than 1 drink per day for nonpregnant women. One drink equals 12 ounces of beer, 5 ounces of wine, or 1 ounces of hard liquor.  Do not use street drugs.  Do not share needles.  Ask your health care provider for help if you need support or information about quitting drugs.  Tell your health care provider if you often feel depressed.  Tell your health care provider if you have ever been abused or do not feel safe at home. This information is not intended to replace advice given to you by your health care provider. Make sure you discuss any questions you have with your health care provider. Document Released: 01/26/2011 Document Revised: 12/19/2015 Document Reviewed: 04/16/2015 Elsevier Interactive Patient Education  2019 [employer].

## 2018-10-13 DIAGNOSIS — Z01419 Encounter for gynecological examination (general) (routine) without abnormal findings: Secondary | ICD-10-CM | POA: Diagnosis not present

## 2018-10-13 NOTE — Addendum Note (Signed)
Addended by: Lorine Bears on: 10/13/2018 08:08 AM   Modules accepted: Orders

## 2018-10-14 LAB — PAP, TP IMAGING W/ HPV RNA, RFLX HPV TYPE 16,18/45: HPV DNA High Risk: NOT DETECTED

## 2018-11-25 DIAGNOSIS — E109 Type 1 diabetes mellitus without complications: Secondary | ICD-10-CM | POA: Diagnosis not present

## 2018-11-25 DIAGNOSIS — E02 Subclinical iodine-deficiency hypothyroidism: Secondary | ICD-10-CM | POA: Diagnosis not present

## 2018-11-25 DIAGNOSIS — E221 Hyperprolactinemia: Secondary | ICD-10-CM | POA: Diagnosis not present

## 2018-11-25 DIAGNOSIS — E7800 Pure hypercholesterolemia, unspecified: Secondary | ICD-10-CM | POA: Diagnosis not present

## 2018-11-30 ENCOUNTER — Other Ambulatory Visit: Payer: Self-pay | Admitting: Endocrinology

## 2018-11-30 DIAGNOSIS — D443 Neoplasm of uncertain behavior of pituitary gland: Secondary | ICD-10-CM

## 2018-11-30 DIAGNOSIS — E221 Hyperprolactinemia: Secondary | ICD-10-CM

## 2018-11-30 DIAGNOSIS — E7800 Pure hypercholesterolemia, unspecified: Secondary | ICD-10-CM | POA: Diagnosis not present

## 2018-11-30 DIAGNOSIS — E109 Type 1 diabetes mellitus without complications: Secondary | ICD-10-CM | POA: Diagnosis not present

## 2018-11-30 DIAGNOSIS — D444 Neoplasm of uncertain behavior of craniopharyngeal duct: Principal | ICD-10-CM

## 2018-12-06 ENCOUNTER — Ambulatory Visit (INDEPENDENT_AMBULATORY_CARE_PROVIDER_SITE_OTHER): Payer: BLUE CROSS/BLUE SHIELD | Admitting: Women's Health

## 2018-12-06 ENCOUNTER — Telehealth: Payer: Self-pay

## 2018-12-06 ENCOUNTER — Encounter: Payer: Self-pay | Admitting: Women's Health

## 2018-12-06 ENCOUNTER — Other Ambulatory Visit: Payer: Self-pay

## 2018-12-06 VITALS — BP 122/80

## 2018-12-06 DIAGNOSIS — N644 Mastodynia: Secondary | ICD-10-CM | POA: Diagnosis not present

## 2018-12-06 NOTE — Telephone Encounter (Signed)
Patient called about ongoing left breast pain. Has had it for awhile. Had mammo and thinks she had u/s. Even took her mammo to orthopedist and saw him.  She said it is off and on and on the side of her breast near chest wall.  She wonders what else she might do. I recommended come back and let [place] recheck the area and go from there. Appt scheduled.

## 2018-12-06 NOTE — Progress Notes (Signed)
53 year old MWF G2, P2 presents with complaint of left upper outer breast pain that is waking her up at night, pain has been present for the past year but is  increasing in intensity.  Denies change in routine, injury, stopped wearing underwire bras, and increased pain has caused increased worry.  States minimal to no discomfort during the day but with any pressure to the outer quadrant of her breast intense pain, sleepless nights, also history of shoulder pain/frozen shoulder.  Normal mammogram history, last mammogram 09/22/2018 breast tissue was dense.  Mother breast cancer survivor, maternal grandmother and maternal great grandmother both died of breast cancer.  Type I diabetic since age 6.  Light monthly cycles, history of  ablation/husband vasectomy.  Exam: Appears well/worried.  Breast examined in sitting and lying position without visible dimpling, erythema, nipple discharge, palpable nodules or masses.  Tenderness present left breast from 12:00 to 3 o'clock position.  Left  breast mastodynia  Plan: Diagnostic left breast mammogram with ultrasound.  Vitamin E twice daily.  Keep scheduled follow-up with orthopedic to evaluate shoulder.

## 2018-12-06 NOTE — Patient Instructions (Addendum)
Arthritis Arthritis is a term that is commonly used to refer to joint pain or joint disease. There are more than 100 types of arthritis. What are the causes? The most common cause of this condition is wear and tear of a joint. Other causes include:  Gout.  Inflammation of a joint.  An infection of a joint.  Sprains and other injuries near the joint.  A drug reaction or allergic reaction. In some cases, the cause may not be known. What are the signs or symptoms? The main symptom of this condition is pain in the joint with movement. Other symptoms include:  Redness, swelling, or stiffness at a joint.  Warmth coming from the joint.  Fever.  Overall feeling of illness. How is this diagnosed? This condition may be diagnosed with a physical exam and tests, including:  Blood tests.  Urine tests.  Imaging tests, such as MRI, X-rays, or a CT scan. Sometimes, fluid is removed from a joint for testing. How is this treated? Treatment for this condition may involve:  Treatment of the cause, if it is known.  Rest.  Raising (elevating) the joint.  Applying cold or hot packs to the joint.  Medicines to improve symptoms and reduce inflammation.  Injections of a steroid such as cortisone into the joint to help reduce pain and inflammation. Depending on the cause of your arthritis, you may need to make lifestyle changes to reduce stress on your joint. These changes may include exercising more and losing weight. Follow these instructions at home: Medicines  Take over-the-counter and prescription medicines only as told by your health care provider.  Do not take aspirin to relieve pain if gout is suspected. Activity  Rest your joint if told by your health care provider. Rest is important when your disease is active and your joint feels painful, swollen, or stiff.  Avoid activities that make the pain worse. It is important to balance activity with rest.  Exercise your joint  regularly with range-of-motion exercises as told by your health care provider. Try doing low-impact exercise, such as: ? Swimming. ? Water aerobics. ? Biking. ? Walking. Joint Care   If your joint is swollen, keep it elevated if told by your health care provider.  If your joint feels stiff in the morning, try taking a warm shower.  If directed, apply heat to the joint. If you have diabetes, do not apply heat without permission from your health care provider. ? Put a towel between the joint and the hot pack or heating pad. ? Leave the heat on the area for 20-30 minutes.  If directed, apply ice to the joint: ? Put ice in a plastic bag. ? Place a towel between your skin and the bag. ? Leave the ice on for 20 minutes, 2-3 times per day.  Keep all follow-up visits as told by your health care provider. This is important. Contact a health care provider if:  The pain gets worse.  You have a fever. Get help right away if:  You develop severe joint pain, swelling, or redness.  Many joints become painful and swollen.  You develop severe back pain.  You develop severe weakness in your leg.  You cannot control your bladder or bowels. This information is not intended to replace advice given to you by your health care provider. Make sure you discuss any questions you have with your health care provider. Document Released: 08/20/2004 Document Revised: 12/19/2015 Document Reviewed: 10/08/2014 Elsevier Interactive Patient Education  2019 Elsevier  Inc.  Breast Tenderness Breast tenderness is a common problem for women of all ages. Breast tenderness may cause mild discomfort to severe pain. The pain usually comes and goes in association with your menstrual cycle, but it can be constant. Breast tenderness has many possible causes, including hormone changes and some medicines. Your health care provider may order tests, such as a mammogram or an ultrasound, to check for any unusual findings.  Having breast tenderness usually does not mean that you have breast cancer. Follow these instructions at home: Sometimes, reassurance that you do not have breast cancer is all that is needed. In general, follow these home care instructions: Managing pain and discomfort   If directed, apply ice to the area: ? Put ice in a plastic bag. ? Place a towel between your skin and the bag. ? Leave the ice on for 20 minutes, 2-3 times a day.  Make sure you are wearing a supportive bra, especially during exercise. You may also want to wear a supportive bra while sleeping if your breasts are very tender. Medicines  Take over-the-counter and prescription medicines only as told by your health care provider. If the cause of your pain is infection, you may be prescribed an antibiotic medicine.  If you were prescribed an antibiotic, take it as told by your health care provider. Do not stop taking the antibiotic even if you start to feel better. General instructions   Your health care provider may recommend that you reduce the amount of fat in your diet. You can do this by: ? Limiting fried foods. ? Cooking foods using methods, such as baking, boiling, grilling, and broiling.  Decrease the amount of caffeine in your diet. You can do this by drinking more water and choosing caffeine-free options.  Keep a log of the days and times when your breasts are most tender.  Ask your health care provider how to do breast exams at home. This will help you notice if you have an unusual growth or lump. Contact a health care provider if:  Any part of your breast is hard, red, and hot to the touch. This may be a sign of infection.  You are not breastfeeding and you have fluid, especially blood or pus, coming out of your nipples.  You have a fever.  You have a new or painful lump in your breast that remains after your menstrual period ends.  Your pain does not improve or it gets worse.  Your pain is interfering  with your daily activities. This information is not intended to replace advice given to you by your health care provider. Make sure you discuss any questions you have with your health care provider. Document Released: 06/25/2008 Document Revised: 04/10/2016 Document Reviewed: 04/10/2016 Elsevier Interactive Patient Education  2019 [employer].

## 2018-12-07 ENCOUNTER — Encounter: Payer: Self-pay | Admitting: *Deleted

## 2018-12-07 ENCOUNTER — Telehealth: Payer: Self-pay | Admitting: *Deleted

## 2018-12-07 DIAGNOSIS — N644 Mastodynia: Secondary | ICD-10-CM

## 2018-12-07 NOTE — Telephone Encounter (Signed)
-----   Message from Huel Cote, NP sent at 12/06/2018  2:36 PM EDT ----- Needs diagnostic left breast, severe tenderness upper outer quad, 1 year but now worse.  Wakes up, normal mammogram 09/22/18  can go anytimes except 5/15 or 12/14/18. M, mgm and mggm with breast cancer

## 2018-12-07 NOTE — Telephone Encounter (Signed)
Patient scheduled at breast center on 12/27/18/20 1:30pm, sent my chart message with time and date.

## 2018-12-09 ENCOUNTER — Other Ambulatory Visit: Payer: Self-pay

## 2018-12-09 ENCOUNTER — Ambulatory Visit
Admission: RE | Admit: 2018-12-09 | Discharge: 2018-12-09 | Disposition: A | Discharge status: Home / Self Care | Payer: BLUE CROSS/BLUE SHIELD | Source: Ambulatory Visit | Attending: Endocrinology | Admitting: Endocrinology

## 2018-12-09 DIAGNOSIS — D443 Neoplasm of uncertain behavior of pituitary gland: Secondary | ICD-10-CM

## 2018-12-09 DIAGNOSIS — E221 Hyperprolactinemia: Secondary | ICD-10-CM

## 2018-12-09 MED ORDER — GADOBENATE DIMEGLUMINE 529 MG/ML IV SOLN
10.0000 mL | Freq: Once | INTRAVENOUS | Status: AC | PRN
  Administered 2018-12-09: 10 mL via INTRAVENOUS

## 2018-12-14 DIAGNOSIS — M25511 Pain in right shoulder: Secondary | ICD-10-CM | POA: Diagnosis not present

## 2018-12-27 ENCOUNTER — Ambulatory Visit
Admission: RE | Admit: 2018-12-27 | Discharge: 2018-12-27 | Disposition: A | Discharge status: Home / Self Care | Payer: BC Managed Care – PPO | Source: Ambulatory Visit | Attending: Women's Health | Admitting: Women's Health

## 2018-12-27 ENCOUNTER — Ambulatory Visit
Admission: RE | Admit: 2018-12-27 | Discharge: 2018-12-27 | Disposition: A | Discharge status: Home / Self Care | Payer: BLUE CROSS/BLUE SHIELD | Source: Ambulatory Visit | Attending: Women's Health | Admitting: Women's Health

## 2018-12-27 ENCOUNTER — Other Ambulatory Visit: Payer: Self-pay

## 2018-12-27 DIAGNOSIS — R922 Inconclusive mammogram: Secondary | ICD-10-CM | POA: Diagnosis not present

## 2018-12-27 DIAGNOSIS — Z803 Family history of malignant neoplasm of breast: Secondary | ICD-10-CM | POA: Diagnosis not present

## 2018-12-27 DIAGNOSIS — N644 Mastodynia: Secondary | ICD-10-CM | POA: Diagnosis not present

## 2019-04-16 NOTE — Progress Notes (Addendum)
[facility] [address], [place], [place] [address] [phone]  Date:  04/19/2019   Name:  Maria White   DOB:  1966-03-07   MRN:  [id number]  PCP:  Darreld Mclean, MD    Chief Complaint: Shoulder Pain (left shoulder pain, frozen shoulder in past, pain under arms has had mammograms ultrasound, bilateral ) and Joint Pain (right ankle pain, finer pain, afriad of RA )   History of Present Illness:  Maria White is a 53 y.o. very pleasant female patient who presents with the following:  Patient with history of type 1 diabetes Here today with concern of joint pain Last seen by myself about 1 year ago Her diabetes is managed by Dr. Chalmers Cater- per pt under good control, A1c is under 7 per patient report  Flu shot- pt declines  Pneumonia vaccine- per pt done already Tetanus vaccine- she thinks maybe 10 years ago but declines booster today  Eye exam- pt does get annually, not sure of exact date Foot exam-we will do today  She is interested in a shingles vaccine - would like to do later on however as furniture market is coming up  A few years ago she hurt her LEFT shoulder in the gym- she rested her shoulder and developed a frozen sholder.  She did PT.  She thinks this was maybe 2015 The shoulder got mostly better although not 100%-however she has enough range of motion that she can use her shoulder without problems  She developed some axillary pain for at least 2 years- bilaterally. She has had breast cancer screening and eval to make sure this was benign-no definite cause found for this pain  She started getting some RIGHT shoulder pain- soreness to pressure- and also nighttime pain.  This has been present for about 9 months Reaching behind her back or in front of her body is painful She saw ortho in approx June and was told that she had a RCT issue- was given some exercise bands and is doing PT at home.  She reports  having x-rays of her shoulder that appeared normal.  Since this time, she has noticed some pain in her wrist and hand as well.  She is concerned about RA and would like to be tested for same   She is also noted some right ankle pain for several months, no acute injury recalled.  The pain seems to mostly on the lateral ankle.  She notes that her orthopedist was not able to look at her ankle as they were a shoulder specialist Patient Active Problem List   Diagnosis Date Noted  . Tennis elbow 02/03/2011  . Fibrocystic breast 02/03/2011  . DM w/o complication type I ([place]) 01/26/2011    Past Medical History:  Diagnosis Date  . Anemia   . Diabetes mellitus    age 38  . Elevated prolactin level ([place])   . Pituitary adenoma ([place]) 9/11   5 mm    Past Surgical History:  Procedure Laterality Date  . COLPOSCOPY    . ENDOMETRIAL ABLATION  8/11   her option  . LASER ABLATION OF THE CERVIX      Social History   Tobacco Use  . Smoking status: Never Smoker  . Smokeless tobacco: Never Used  Substance Use Topics  . Alcohol use: No  . Drug use: No    Family History  Problem Relation Age of Onset  . Breast cancer  Maternal Grandmother 41       and great grandmother  . Hypertension Mother   . Breast cancer Mother 45  . Breast cancer Other     No Known Allergies  Medication list has been reviewed and updated.  Current Outpatient Medications on File Prior to Visit  Medication Sig Dispense Refill  . glucose blood (ONE TOUCH ULTRA TEST) test strip Use as instructed test 8 to 10 times a day 700 each 3  . insulin lispro (HUMALOG) 100 UNIT/ML injection Sliding scale 3 mL 3  . Insulin Syringe-Needle U-100 (B-D INS SYR ULTRAFINE .5CC/30G) 30G X 1/2" 0.5 ML MISC Use as directed to administer insulin 600 each 3  . Multiple Vitamins-Minerals (MULTIVITAMIN ADULT PO) Take 1 tablet by mouth daily.    . TRESIBA FLEXTOUCH 100 UNIT/ML SOPN FlexTouch Pen as directed.     No current  facility-administered medications on file prior to visit.     Review of Systems:  As per HPI- otherwise negative.  No fever or chills, no chest pain or shortness of breath Status post endometrial ablation, per patient no risk of pregnancy as her husband also had a vasectomy Physical Examination: Vitals:   04/19/19 1604  BP: 126/80  Pulse: 86  Resp: 16  Temp: 97.6 F (36.4 C)  SpO2: 97%   Vitals:   04/19/19 1604  Weight: 147 lb (66.7 kg)  Height: 5\' 4"  (1.626 m)   Body mass index is 25.23 kg/m. Ideal Body Weight: Weight in (lb) to have BMI = 25: 145.3  GEN: WDWN, NAD, Non-toxic, A & O x 3, normal weight, looks well HEENT: Atraumatic, Normocephalic. Neck supple. No masses, No LAD. Ears and Nose: No external deformity. CV: RRR, No M/G/R. No JVD. No thrill. No extra heart sounds. PULM: CTA B, no wheezes, crackles, rhonchi. No retractions. No resp. distress. No accessory muscle use. EXTR: No c/c/e NEURO Normal gait.  PSYCH: Normally interactive. Conversant. Not depressed or anxious appearing.  Calm demeanor.  She has somewhat reduced range of motion of both shoulders The right shoulder is painful with internal and external rotation.  Range of motion about 90% of normal No gross abnormality noted in her wrists or hands.  Joints appear normal and have normal range of motion, no nodules or heat Right ankle exam is also normal.  No swelling, effusion, bruising.  Not really able to reproduce her tenderness, she notes perhaps some mild discomfort over the lateral malleolus   Assessment and Plan: Pain in joint of right shoulder - Plan: Sedimentation rate, C-reactive protein, Rheumatoid Factor, Cyclic citrul peptide antibody, IgG (QUEST), ANA  Pain in right hand - Plan: DG Hand Complete Right  Chronic pain of right ankle - Plan: DG Ankle Complete Right  Here today with concern of some joint pains.  She is mostly concerned that she might have rheumatoid arthritis.  Will obtain a  film of her right hand and right ankle today, also will run labs as above to look for any evidence of RA. I will be in touch with her when her results come in Discussed recommended immunizations, she plans to do Shingrix later  Signed Lamar Blinks, MD  Received her labs 9/25- message to pt  Results for orders placed or performed in visit on 04/19/19  Sedimentation rate  Result Value Ref Range   Sed Rate 4 0 - 30 mm/hr  C-reactive protein  Result Value Ref Range   CRP <1.0 0.5 - 20.0 mg/dL  Rheumatoid Factor  Result  Value Ref Range   Rhuematoid fact SerPl-aCnc 99991111 [id number] IU/mL  Cyclic citrul peptide antibody, IgG (QUEST)  Result Value Ref Range   Cyclic Citrullin Peptide Ab <16 UNITS  ANA  Result Value Ref Range   Anti Nuclear Antibody (ANA) NEGATIVE NEGATIVE

## 2019-04-19 ENCOUNTER — Encounter: Payer: Self-pay | Admitting: Family Medicine

## 2019-04-19 ENCOUNTER — Ambulatory Visit (INDEPENDENT_AMBULATORY_CARE_PROVIDER_SITE_OTHER): Payer: BC Managed Care – PPO | Admitting: Family Medicine

## 2019-04-19 ENCOUNTER — Ambulatory Visit (HOSPITAL_BASED_OUTPATIENT_CLINIC_OR_DEPARTMENT_OTHER)
Admission: RE | Admit: 2019-04-19 | Discharge: 2019-04-19 | Disposition: A | Discharge status: Home / Self Care | Payer: BC Managed Care – PPO | Source: Ambulatory Visit | Attending: Family Medicine | Admitting: Family Medicine

## 2019-04-19 ENCOUNTER — Other Ambulatory Visit: Payer: Self-pay

## 2019-04-19 VITALS — BP 126/80 | HR 86 | Temp 97.6°F | Resp 16 | Ht 64.0 in | Wt 147.0 lb

## 2019-04-19 DIAGNOSIS — M25511 Pain in right shoulder: Secondary | ICD-10-CM

## 2019-04-19 DIAGNOSIS — G8929 Other chronic pain: Secondary | ICD-10-CM

## 2019-04-19 DIAGNOSIS — M79641 Pain in right hand: Secondary | ICD-10-CM

## 2019-04-19 DIAGNOSIS — M25571 Pain in right ankle and joints of right foot: Secondary | ICD-10-CM | POA: Insufficient documentation

## 2019-04-19 DIAGNOSIS — Z23 Encounter for immunization: Secondary | ICD-10-CM

## 2019-04-19 NOTE — Patient Instructions (Signed)
I should have your labs and x-rays by tomorrow most likely- will be in touch asap We can do the shingles vaccine at your convenience

## 2019-04-20 ENCOUNTER — Encounter: Payer: Self-pay | Admitting: Family Medicine

## 2019-04-20 LAB — C-REACTIVE PROTEIN: CRP: <1 mg/dL (ref 0.5–20.0)

## 2019-04-20 LAB — SEDIMENTATION RATE: Sed Rate: 4 mm/hr (ref 0–30)

## 2019-04-21 ENCOUNTER — Encounter: Payer: Self-pay | Admitting: Family Medicine

## 2019-04-21 LAB — CYCLIC CITRUL PEPTIDE ANTIBODY, IGG: Cyclic Citrullin Peptide Ab: <16 UNITS

## 2019-04-21 LAB — RHEUMATOID FACTOR: Rheumatoid fact SerPl-aCnc: <14 IU/mL (ref <14)

## 2019-04-21 LAB — ANA: Anti Nuclear Antibody (ANA): NEGATIVE

## 2019-05-20 DIAGNOSIS — Z20828 Contact with and (suspected) exposure to other viral communicable diseases: Secondary | ICD-10-CM | POA: Diagnosis not present

## 2019-05-20 DIAGNOSIS — R0989 Other specified symptoms and signs involving the circulatory and respiratory systems: Secondary | ICD-10-CM | POA: Diagnosis not present

## 2019-06-07 DIAGNOSIS — E109 Type 1 diabetes mellitus without complications: Secondary | ICD-10-CM | POA: Diagnosis not present

## 2019-06-07 DIAGNOSIS — E221 Hyperprolactinemia: Secondary | ICD-10-CM | POA: Diagnosis not present

## 2019-06-07 DIAGNOSIS — E7800 Pure hypercholesterolemia, unspecified: Secondary | ICD-10-CM | POA: Diagnosis not present

## 2019-06-07 DIAGNOSIS — E02 Subclinical iodine-deficiency hypothyroidism: Secondary | ICD-10-CM | POA: Diagnosis not present

## 2019-06-16 DIAGNOSIS — M7501 Adhesive capsulitis of right shoulder: Secondary | ICD-10-CM | POA: Diagnosis not present

## 2019-06-21 DIAGNOSIS — D443 Neoplasm of uncertain behavior of pituitary gland: Secondary | ICD-10-CM | POA: Diagnosis not present

## 2019-06-21 DIAGNOSIS — E109 Type 1 diabetes mellitus without complications: Secondary | ICD-10-CM | POA: Diagnosis not present

## 2019-06-21 DIAGNOSIS — E7800 Pure hypercholesterolemia, unspecified: Secondary | ICD-10-CM | POA: Diagnosis not present

## 2019-06-21 DIAGNOSIS — E221 Hyperprolactinemia: Secondary | ICD-10-CM | POA: Diagnosis not present

## 2019-06-29 DIAGNOSIS — H43813 Vitreous degeneration, bilateral: Secondary | ICD-10-CM | POA: Diagnosis not present

## 2019-06-29 DIAGNOSIS — E103293 Type 1 diabetes mellitus with mild nonproliferative diabetic retinopathy without macular edema, bilateral: Secondary | ICD-10-CM | POA: Diagnosis not present

## 2019-07-03 DIAGNOSIS — H6981 Other specified disorders of Eustachian tube, right ear: Secondary | ICD-10-CM | POA: Diagnosis not present

## 2019-07-04 DIAGNOSIS — M25511 Pain in right shoulder: Secondary | ICD-10-CM | POA: Diagnosis not present

## 2019-07-07 DIAGNOSIS — M25511 Pain in right shoulder: Secondary | ICD-10-CM | POA: Diagnosis not present

## 2019-07-11 DIAGNOSIS — M25511 Pain in right shoulder: Secondary | ICD-10-CM | POA: Diagnosis not present

## 2019-07-13 DIAGNOSIS — M25511 Pain in right shoulder: Secondary | ICD-10-CM | POA: Diagnosis not present

## 2019-07-17 DIAGNOSIS — M25511 Pain in right shoulder: Secondary | ICD-10-CM | POA: Diagnosis not present

## 2019-07-19 DIAGNOSIS — M25511 Pain in right shoulder: Secondary | ICD-10-CM | POA: Diagnosis not present

## 2019-07-27 DIAGNOSIS — M25511 Pain in right shoulder: Secondary | ICD-10-CM | POA: Diagnosis not present

## 2019-08-01 DIAGNOSIS — M25511 Pain in right shoulder: Secondary | ICD-10-CM | POA: Diagnosis not present

## 2019-08-03 DIAGNOSIS — M25511 Pain in right shoulder: Secondary | ICD-10-CM | POA: Diagnosis not present

## 2019-08-08 DIAGNOSIS — M25511 Pain in right shoulder: Secondary | ICD-10-CM | POA: Diagnosis not present

## 2019-08-10 DIAGNOSIS — M25511 Pain in right shoulder: Secondary | ICD-10-CM | POA: Diagnosis not present

## 2019-08-11 DIAGNOSIS — M7501 Adhesive capsulitis of right shoulder: Secondary | ICD-10-CM | POA: Diagnosis not present

## 2019-08-15 DIAGNOSIS — M25511 Pain in right shoulder: Secondary | ICD-10-CM | POA: Diagnosis not present

## 2019-08-18 DIAGNOSIS — M25511 Pain in right shoulder: Secondary | ICD-10-CM | POA: Diagnosis not present

## 2019-08-24 DIAGNOSIS — M25511 Pain in right shoulder: Secondary | ICD-10-CM | POA: Diagnosis not present

## 2019-08-28 DIAGNOSIS — M25511 Pain in right shoulder: Secondary | ICD-10-CM | POA: Diagnosis not present

## 2019-08-30 DIAGNOSIS — M25511 Pain in right shoulder: Secondary | ICD-10-CM | POA: Diagnosis not present

## 2019-09-04 DIAGNOSIS — M25511 Pain in right shoulder: Secondary | ICD-10-CM | POA: Diagnosis not present

## 2019-09-06 DIAGNOSIS — M25511 Pain in right shoulder: Secondary | ICD-10-CM | POA: Diagnosis not present

## 2019-09-13 DIAGNOSIS — M25511 Pain in right shoulder: Secondary | ICD-10-CM | POA: Diagnosis not present

## 2019-09-15 DIAGNOSIS — M25511 Pain in right shoulder: Secondary | ICD-10-CM | POA: Diagnosis not present

## 2019-09-19 DIAGNOSIS — M25511 Pain in right shoulder: Secondary | ICD-10-CM | POA: Diagnosis not present

## 2019-09-22 DIAGNOSIS — M25511 Pain in right shoulder: Secondary | ICD-10-CM | POA: Diagnosis not present

## 2019-10-13 ENCOUNTER — Other Ambulatory Visit: Payer: Self-pay

## 2019-10-16 ENCOUNTER — Ambulatory Visit (INDEPENDENT_AMBULATORY_CARE_PROVIDER_SITE_OTHER): Payer: BC Managed Care – PPO | Admitting: Women's Health

## 2019-10-16 ENCOUNTER — Encounter: Payer: Self-pay | Admitting: Women's Health

## 2019-10-16 ENCOUNTER — Other Ambulatory Visit: Payer: Self-pay

## 2019-10-16 VITALS — BP 132/80 | Ht 64.0 in | Wt 151.0 lb

## 2019-10-16 DIAGNOSIS — Z01419 Encounter for gynecological examination (general) (routine) without abnormal findings: Secondary | ICD-10-CM

## 2019-10-16 DIAGNOSIS — N938 Other specified abnormal uterine and vaginal bleeding: Secondary | ICD-10-CM

## 2019-10-16 NOTE — Progress Notes (Signed)
Maria White [birth year] [id number]    History:    Presents for annual exam.  Postmenopausal on no HRT with occasional spotting, minimal menopausal symptoms.    History of an ablation/husband vasectomy.  Normal Pap and mammogram history.  07/2009 colonoscopy.  Type 1 diabetes age 54 on insulin, has a Dexcom.  History of elevated prolactin has been low greater than 5 years.  Past medical history, past surgical history, family history and social history were all reviewed and documented in the EPIC chart.  Homemaker.  Oldest daughter works for the [employer], youngest daughter recently graduated from college hoping to go to [place].  ROS:  A ROS was performed and pertinent positives and negatives are included.  Exam:  Vitals:   10/16/19 1005  BP: 132/80  Weight: 151 lb (68.5 kg)  Height: 5\' 4"  (1.626 m)   Body mass index is 25.92 kg/m.   General appearance:  Normal Thyroid:  Symmetrical, normal in size, without palpable masses or nodularity. Respiratory  Auscultation:  Clear without wheezing or rhonchi Cardiovascular  Auscultation:  Regular rate, without rubs, murmurs or gallops  Edema/varicosities:  Not grossly evident Abdominal  Soft,nontender, without masses, guarding or rebound.  Liver/spleen:  No organomegaly noted  Hernia:  None appreciated  Skin  Inspection:  Grossly normal   Breasts: Examined lying and sitting.     Right: Without masses, retractions, discharge or axillary adenopathy.     Left: Without masses, retractions, discharge or axillary adenopathy. Gentitourinary   Inguinal/mons:  Normal without inguinal adenopathy  External genitalia:  Normal  BUS/Urethra/Skene's glands:  Normal  Vagina:  Normal  Cervix:  Normal  Uterus:  normal in size, shape and contour.  Midline and mobile  Adnexa/parametria:     Rt: Without masses or tenderness.   Lt: Without masses or tenderness.  Anus and perineum: Normal  Digital rectal exam: Normal sphincter tone without  palpated masses or tenderness  Assessment/Plan:  55 y.o. MWF G3 P2 for annual exam with no complaints.  Postmenopausal/no HRT/with occasional spotting/ history of an ablation Type 1 diabetes-Dr. Michiel Sites manages meds and labs Labs-primary care  Plan: Schedule ultrasound.  SBEs, continue annual 3D screening mammogram, calcium rich foods, vitamin D 2000 IUs daily.  Aware of importance of healthy lifestyle with diet and exercise will continue.  Due for repeat colonoscopy will schedule at [facility].  Pap normal 2020, new screening guidelines reviewed.Maria White WHNP, 1:16 PM 10/16/2019

## 2019-10-16 NOTE — Patient Instructions (Signed)
Good to see you and has been a pleasure knowing you! Vit D 2000 iu daily Health Maintenance for Postmenopausal Women Menopause is a normal process in which your ability to get pregnant comes to an end. This process happens slowly over many months or years, usually between the ages of 31 and 34. Menopause is complete when you have missed your menstrual periods for 12 months. It is important to talk with your health care provider about some of the most common conditions that affect women after menopause (postmenopausal women). These include heart disease, cancer, and bone loss (osteoporosis). Adopting a healthy lifestyle and getting preventive care can help to promote your health and wellness. The actions you take can also lower your chances of developing some of these common conditions. What should I know about menopause? During menopause, you may get a number of symptoms, such as:  Hot flashes. These can be moderate or severe.  Night sweats.  Decrease in sex drive.  Mood swings.  Headaches.  Tiredness.  Irritability.  Memory problems.  Insomnia. Choosing to treat or not to treat these symptoms is a decision that you make with your health care provider. Do I need hormone replacement therapy?  Hormone replacement therapy is effective in treating symptoms that are caused by menopause, such as hot flashes and night sweats.  Hormone replacement carries certain risks, especially as you become older. If you are thinking about using estrogen or estrogen with progestin, discuss the benefits and risks with your health care provider. What is my risk for heart disease and stroke? The risk of heart disease, heart attack, and stroke increases as you age. One of the causes may be a change in the body's hormones during menopause. This can affect how your body uses dietary fats, triglycerides, and cholesterol. Heart attack and stroke are medical emergencies. There are many things that you can do to  help prevent heart disease and stroke. Watch your blood pressure  High blood pressure causes heart disease and increases the risk of stroke. This is more likely to develop in people who have high blood pressure readings, are of African descent, or are overweight.  Have your blood pressure checked: ? Every 3-5 years if you are 68-84 years of age. ? Every year if you are 55 years old or older. Eat a healthy diet   Eat a diet that includes plenty of vegetables, fruits, low-fat dairy products, and lean protein.  Do not eat a lot of foods that are high in solid fats, added sugars, or sodium. Get regular exercise Get regular exercise. This is one of the most important things you can do for your health. Most adults should:  Try to exercise for at least 150 minutes each week. The exercise should increase your heart rate and make you sweat (moderate-intensity exercise).  Try to do strengthening exercises at least twice each week. Do these in addition to the moderate-intensity exercise.  Spend less time sitting. Even light physical activity can be beneficial. Other tips  Work with your health care provider to achieve or maintain a healthy weight.  Do not use any products that contain nicotine or tobacco, such as cigarettes, e-cigarettes, and chewing tobacco. If you need help quitting, ask your health care provider.  Know your numbers. Ask your health care provider to check your cholesterol and your blood sugar (glucose). Continue to have your blood tested as directed by your health care provider. Do I need screening for cancer? Depending on your health history and  family history, you may need to have cancer screening at different stages of your life. This may include screening for:  Breast cancer.  Cervical cancer.  Lung cancer.  Colorectal cancer. What is my risk for osteoporosis? After menopause, you may be at increased risk for osteoporosis. Osteoporosis is a condition in which bone  destruction happens more quickly than new bone creation. To help prevent osteoporosis or the bone fractures that can happen because of osteoporosis, you may take the following actions:  If you are 40-57 years old, get at least 1,000 mg of calcium and at least 600 mg of vitamin D per day.  If you are older than age 76 but younger than age 55, get at least 1,200 mg of calcium and at least 600 mg of vitamin D per day.  If you are older than age 16, get at least 1,200 mg of calcium and at least 800 mg of vitamin D per day. Smoking and drinking excessive alcohol increase the risk of osteoporosis. Eat foods that are rich in calcium and vitamin D, and do weight-bearing exercises several times each week as directed by your health care provider. How does menopause affect my mental health? Depression may occur at any age, but it is more common as you become older. Common symptoms of depression include:  Low or sad mood.  Changes in sleep patterns.  Changes in appetite or eating patterns.  Feeling an overall lack of motivation or enjoyment of activities that you previously enjoyed.  Frequent crying spells. Talk with your health care provider if you think that you are experiencing depression. General instructions See your health care provider for regular wellness exams and vaccines. This may include:  Scheduling regular health, dental, and eye exams.  Getting and maintaining your vaccines. These include: ? Influenza vaccine. Get this vaccine each year before the flu season begins. ? Pneumonia vaccine. ? Shingles vaccine. ? Tetanus, diphtheria, and pertussis (Tdap) booster vaccine. Your health care provider may also recommend other immunizations. Tell your health care provider if you have ever been abused or do not feel safe at home. Summary  Menopause is a normal process in which your ability to get pregnant comes to an end.  This condition causes hot flashes, night sweats, decreased  interest in sex, mood swings, headaches, or lack of sleep.  Treatment for this condition may include hormone replacement therapy.  Take actions to keep yourself healthy, including exercising regularly, eating a healthy diet, watching your weight, and checking your blood pressure and blood sugar levels.  Get screened for cancer and depression. Make sure that you are up to date with all your vaccines. This information is not intended to replace advice given to you by your health care provider. Make sure you discuss any questions you have with your health care provider. Document Revised: 07/06/2018 Document Reviewed: 07/06/2018 Elsevier Patient Education  2020 [employer].

## 2019-10-24 ENCOUNTER — Other Ambulatory Visit: Payer: Self-pay | Admitting: Women's Health

## 2019-10-24 DIAGNOSIS — Z1231 Encounter for screening mammogram for malignant neoplasm of breast: Secondary | ICD-10-CM

## 2019-10-25 ENCOUNTER — Ambulatory Visit
Admission: RE | Admit: 2019-10-25 | Discharge: 2019-10-25 | Disposition: A | Discharge status: Home / Self Care | Payer: BC Managed Care – PPO | Source: Ambulatory Visit | Attending: Women's Health | Admitting: Women's Health

## 2019-10-25 ENCOUNTER — Other Ambulatory Visit: Payer: Self-pay

## 2019-10-25 DIAGNOSIS — Z1231 Encounter for screening mammogram for malignant neoplasm of breast: Secondary | ICD-10-CM

## 2019-10-31 ENCOUNTER — Ambulatory Visit: Payer: BC Managed Care – PPO | Admitting: Women's Health

## 2019-10-31 ENCOUNTER — Encounter: Payer: Self-pay | Admitting: Women's Health

## 2019-10-31 ENCOUNTER — Ambulatory Visit (INDEPENDENT_AMBULATORY_CARE_PROVIDER_SITE_OTHER): Payer: BC Managed Care – PPO

## 2019-10-31 ENCOUNTER — Other Ambulatory Visit: Payer: BC Managed Care – PPO

## 2019-10-31 ENCOUNTER — Other Ambulatory Visit: Payer: Self-pay

## 2019-10-31 VITALS — BP 118/80

## 2019-10-31 DIAGNOSIS — N938 Other specified abnormal uterine and vaginal bleeding: Secondary | ICD-10-CM | POA: Diagnosis not present

## 2019-10-31 NOTE — Progress Notes (Signed)
54 year old MWF G3 P2 presents for ultrasound.  At annual exam 10/16/2019 reported menopausal symptoms occasional spotting, history of an ablation/vasectomy.  Reports mother menopausal in her later 34s . Type I diabetic on insulin since age 27 has Dexcom and insulin pump.  Exam: Appears well.  Ultrasound: Both abdominal and transvaginal techniques necessary, anteverted uterus normal size and shape, no myometrial masses.  Thin symmetrical endometrium 3.4 mm.  No masses or thickening or fluid collection seen.  Both ovaries normal size with normal follicular pattern 22 x 12 mm resolving corpus luteal cyst on the left side.  No adnexal masses.  No free fluid.  Normal GYN ultrasound Spotting/ablation  Plan: Reviewed not menopausal, thin endometrium, follicles on ovaries, ovaries still producing estrogen.  Reassurance given regarding normality of cyclic spotting, history of ablation.  Instructed to call if menopausal symptoms become problematic.

## 2019-11-28 ENCOUNTER — Encounter: Payer: Self-pay | Admitting: Gastroenterology

## 2019-11-28 ENCOUNTER — Telehealth: Payer: Self-pay | Admitting: Gastroenterology

## 2019-11-28 NOTE — Telephone Encounter (Signed)
Hi Dr. Loletha Carrow,  We received medical records along with Op\Path reports. Had colonscopy and EGD in 2011.

## 2019-12-22 ENCOUNTER — Ambulatory Visit (AMBULATORY_SURGERY_CENTER): Payer: Self-pay | Admitting: *Deleted

## 2019-12-22 ENCOUNTER — Other Ambulatory Visit: Payer: Self-pay

## 2019-12-22 VITALS — Ht 64.5 in | Wt 146.0 lb

## 2019-12-22 DIAGNOSIS — Z01818 Encounter for other preprocedural examination: Secondary | ICD-10-CM

## 2019-12-22 DIAGNOSIS — Z1211 Encounter for screening for malignant neoplasm of colon: Secondary | ICD-10-CM

## 2019-12-22 MED ORDER — SUTAB 1479-225-188 MG PO TABS: 1.0000 | ORAL_TABLET | Freq: Once | ORAL | 0 refills | Status: AC

## 2019-12-22 NOTE — Progress Notes (Signed)

## 2020-01-03 ENCOUNTER — Encounter: Payer: Self-pay | Admitting: Gastroenterology

## 2020-01-05 ENCOUNTER — Ambulatory Visit (INDEPENDENT_AMBULATORY_CARE_PROVIDER_SITE_OTHER): Payer: BC Managed Care – PPO

## 2020-01-05 ENCOUNTER — Other Ambulatory Visit: Payer: Self-pay | Admitting: Gastroenterology

## 2020-01-05 DIAGNOSIS — Z1159 Encounter for screening for other viral diseases: Secondary | ICD-10-CM | POA: Diagnosis not present

## 2020-01-05 LAB — SARS CORONAVIRUS 2 (TAT 6-24 HRS): SARS Coronavirus 2: NEGATIVE

## 2020-01-08 ENCOUNTER — Telehealth: Payer: Self-pay | Admitting: Gastroenterology

## 2020-01-08 NOTE — Telephone Encounter (Signed)
[phone] called pt- went to voice mail= LMTRC marie PV

## 2020-01-08 NOTE — Telephone Encounter (Signed)
Colon Wednesday at 9 am -- pt concerned about her blood  sugar dropping after 6 am -- instructed her to adjust DM meds as directed - to drink som liquids with sugar wed am - gave her the after hours number to call with Blood Sugar issues - informed her we will start D5 at 8 am if necessary- she verbalized nderstanding and willl call if has issues

## 2020-01-08 NOTE — Telephone Encounter (Signed)
Pt is scheduled for a colonoscopy 01/10/20 at 9:00 am.  Pt expressed concern about being diabetic and not being able to eat solid foods.

## 2020-01-09 ENCOUNTER — Encounter: Payer: Self-pay | Admitting: Certified Registered Nurse Anesthetist

## 2020-01-09 DIAGNOSIS — E02 Subclinical iodine-deficiency hypothyroidism: Secondary | ICD-10-CM | POA: Diagnosis not present

## 2020-01-09 DIAGNOSIS — E7800 Pure hypercholesterolemia, unspecified: Secondary | ICD-10-CM | POA: Diagnosis not present

## 2020-01-09 DIAGNOSIS — E221 Hyperprolactinemia: Secondary | ICD-10-CM | POA: Diagnosis not present

## 2020-01-09 DIAGNOSIS — E109 Type 1 diabetes mellitus without complications: Secondary | ICD-10-CM | POA: Diagnosis not present

## 2020-01-10 ENCOUNTER — Other Ambulatory Visit: Payer: Self-pay

## 2020-01-10 ENCOUNTER — Encounter: Payer: Self-pay | Admitting: Gastroenterology

## 2020-01-10 ENCOUNTER — Ambulatory Visit (AMBULATORY_SURGERY_CENTER): Payer: BC Managed Care – PPO | Admitting: Gastroenterology

## 2020-01-10 VITALS — BP 114/63 | HR 66 | Temp 98.2°F | Resp 18 | Ht 64.0 in | Wt 150.0 lb

## 2020-01-10 DIAGNOSIS — K635 Polyp of colon: Secondary | ICD-10-CM | POA: Diagnosis not present

## 2020-01-10 DIAGNOSIS — Z1211 Encounter for screening for malignant neoplasm of colon: Secondary | ICD-10-CM

## 2020-01-10 DIAGNOSIS — D122 Benign neoplasm of ascending colon: Secondary | ICD-10-CM

## 2020-01-10 MED ORDER — SODIUM CHLORIDE 0.9 % IV SOLN: 500.0000 mL | Freq: Once | INTRAVENOUS | Status: DC

## 2020-01-10 NOTE — Progress Notes (Signed)
Called to room to assist during endoscopic procedure.  Patient ID and intended procedure confirmed with present staff. Received instructions for my participation in the procedure from the performing physician.  

## 2020-01-10 NOTE — Patient Instructions (Signed)
Please read handouts provided. Continue present medications. Await pathology results.   YOU HAD AN ENDOSCOPIC PROCEDURE TODAY AT THE [facility]:   Refer to the procedure report that was given to you for any specific questions about what was found during the examination.  If the procedure report does not answer your questions, please call your gastroenterologist to clarify.  If you requested that your care partner not be given the details of your procedure findings, then the procedure report has been included in a sealed envelope for you to review at your convenience later.  YOU SHOULD EXPECT: Some feelings of bloating in the abdomen. Passage of more gas than usual.  Walking can help get rid of the air that was put into your GI tract during the procedure and reduce the bloating. If you had a lower endoscopy (such as a colonoscopy or flexible sigmoidoscopy) you may notice spotting of blood in your stool or on the toilet paper. If you underwent a bowel prep for your procedure, you may not have a normal bowel movement for a few days.  Please Note:  You might notice some irritation and congestion in your nose or some drainage.  This is from the oxygen used during your procedure.  There is no need for concern and it should clear up in a day or so.  SYMPTOMS TO REPORT IMMEDIATELY:  Following lower endoscopy (colonoscopy or flexible sigmoidoscopy):  Excessive amounts of blood in the stool  Significant tenderness or worsening of abdominal pains  Swelling of the abdomen that is new, acute  Fever of 100F or higher   For urgent or emergent issues, a gastroenterologist can be reached at any hour by calling [phone]. Do not use MyChart messaging for urgent concerns.    DIET:  We do recommend a small meal at first, but then you may proceed to your regular diet.  Drink plenty of fluids but you should avoid alcoholic beverages for 24 hours.  ACTIVITY:  You should plan to take it easy  for the rest of today and you should NOT DRIVE or use heavy machinery until tomorrow (because of the sedation medicines used during the test).    FOLLOW UP: Our staff will call the number listed on your records 48-72 hours following your procedure to check on you and address any questions or concerns that you may have regarding the information given to you following your procedure. If we do not reach you, we will leave a message.  We will attempt to reach you two times.  During this call, we will ask if you have developed any symptoms of COVID 19. If you develop any symptoms (ie: fever, flu-like symptoms, shortness of breath, cough etc.) before then, please call [phone].  If you test positive for Covid 19 in the 2 weeks post procedure, please call and report this information to us.    If any biopsies were taken you will be contacted by phone or by letter within the next 1-3 weeks.  Please call us at [phone] if you have not heard about the biopsies in 3 weeks.    SIGNATURES/CONFIDENTIALITY: You and/or your care partner have signed paperwork which will be entered into your electronic medical record.  These signatures attest to the fact that that the information above on your After Visit Summary has been reviewed and is understood.  Full responsibility of the confidentiality of this discharge information lies with you and/or your care-partner.  

## 2020-01-10 NOTE — Progress Notes (Signed)
Vitals-CW ?

## 2020-01-10 NOTE — Progress Notes (Signed)
Report given to PACU, vss 

## 2020-01-10 NOTE — Op Note (Signed)
[facility] Patient Name: Maria White Procedure Date: 01/10/2020 8:39 AM MRN: [id number] Endoscopist: [place]. Loletha Carrow , MD Age: 54 Referring MD:  Date of Birth: Feb 25, 1966 Gender: Female Account #: [id number] Procedure:                Colonoscopy Indications:              Screening for colorectal malignant neoplasm, This                            is the patient's first screening colonoscopy Medicines:                Monitored Anesthesia Care Procedure:                Pre-Anesthesia Assessment:                           - Prior to the procedure, a History and Physical                            was performed, and patient medications and                            allergies were reviewed. The patient's tolerance of                            previous anesthesia was also reviewed. The risks                            and benefits of the procedure and the sedation                            options and risks were discussed with the patient.                            All questions were answered, and informed consent                            was obtained. Prior Anticoagulants: The patient has                            taken no previous anticoagulant or antiplatelet                            agents. ASA Grade Assessment: II - A patient with                            mild systemic disease. After reviewing the risks                            and benefits, the patient was deemed in                            satisfactory condition to undergo the procedure.  After obtaining informed consent, the colonoscope                            was passed under direct vision. Throughout the                            procedure, the patient's blood pressure, pulse, and                            oxygen saturations were monitored continuously. The                            Colonoscope was introduced through the anus and                            advanced to the  the cecum, identified by                            appendiceal orifice and ileocecal valve. The                            colonoscopy was performed without difficulty. The                            patient tolerated the procedure well. The quality                            of the bowel preparation was excellent. The                            ileocecal valve, appendiceal orifice, and rectum                            were photographed. The bowel preparation used was                            Sutab. Scope In: 8:47:25 AM Scope Out: 9:00:55 AM Scope Withdrawal Time: 0 hours 10 minutes 52 seconds  Total Procedure Duration: 0 hours 13 minutes 30 seconds  Findings:                 The perianal and digital rectal examinations were                            normal.                           A 8 mm polyp was found in the distal ascending                            colon. The polyp was sessile. The polyp was removed                            with a cold snare. Resection and retrieval were  complete.                           Multiple diverticula were found in the left colon.                           The exam was otherwise without abnormality on                            direct and retroflexion views. Complications:            No immediate complications. Estimated Blood Loss:     Estimated blood loss was minimal. Impression:               - One 8 mm polyp in the distal ascending colon,                            removed with a cold snare. Resected and retrieved.                           - Diverticulosis in the left colon.                           - The examination was otherwise normal on direct                            and retroflexion views. Recommendation:           - Patient has a contact number available for                            emergencies. The signs and symptoms of potential                            delayed complications were discussed with the                             patient. Return to normal activities tomorrow.                            Written discharge instructions were provided to the                            patient.                           - Resume previous diet.                           - Continue present medications.                           - Await pathology results.                           - Repeat colonoscopy is recommended for  surveillance. The colonoscopy date will be                            determined after pathology results from today's                            exam become available for review. Kaliya Shreiner L. Loletha Carrow, MD 01/10/2020 9:05:41 AM This report has been signed electronically.

## 2020-01-12 ENCOUNTER — Encounter: Payer: Self-pay | Admitting: Gastroenterology

## 2020-01-12 ENCOUNTER — Telehealth: Payer: Self-pay | Admitting: *Deleted

## 2020-01-12 ENCOUNTER — Telehealth: Payer: Self-pay

## 2020-01-12 NOTE — Telephone Encounter (Signed)
Called [phone] and left a messaged we tried to reach pt for a follow up call. maw

## 2020-01-12 NOTE — Telephone Encounter (Signed)
  Follow up Call-  Call back number 01/10/2020  Post procedure Call Back phone  # [phone]  Permission to leave phone message Yes  Some recent data might be hidden     Patient questions:  Do you have a fever, pain , or abdominal swelling? No. Pain Score  0 *  Have you tolerated food without any problems? Yes.    Have you been able to return to your normal activities? Yes.    Do you have any questions about your discharge instructions: Diet   No. Medications  No. Follow up visit  No.  Do you have questions or concerns about your Care? No.  Actions: * If pain score is 4 or above: No action needed, pain <4.   1. Have you developed a fever since your procedure? no  2.   Have you had an respiratory symptoms (SOB or cough) since your procedure? no  3.   Have you tested positive for COVID 19 since your procedure no  4.   Have you had any family members/close contacts diagnosed with the COVID 19 since your procedure?  no   If yes to any of these questions please route to Joylene John, RN and Erenest Rasher, RN

## 2020-01-17 DIAGNOSIS — E221 Hyperprolactinemia: Secondary | ICD-10-CM | POA: Diagnosis not present

## 2020-01-17 DIAGNOSIS — E02 Subclinical iodine-deficiency hypothyroidism: Secondary | ICD-10-CM | POA: Diagnosis not present

## 2020-01-17 DIAGNOSIS — D443 Neoplasm of uncertain behavior of pituitary gland: Secondary | ICD-10-CM | POA: Diagnosis not present

## 2020-01-17 DIAGNOSIS — E109 Type 1 diabetes mellitus without complications: Secondary | ICD-10-CM | POA: Diagnosis not present

## 2020-01-17 LAB — BASIC METABOLIC PANEL
BUN: 14 (ref 4–21)
CO2: 28 — AB (ref 13–22)
Chloride: 102 (ref 99–108)
Creatinine: 0.7 (ref 0.5–1.1)
Glucose: 165
Potassium: 4.6 (ref 3.4–5.3)
Sodium: 139 (ref 137–147)

## 2020-01-17 LAB — TSH: TSH: 2.58 (ref 0.41–5.90)

## 2020-01-17 LAB — COMPREHENSIVE METABOLIC PANEL
Calcium: 8.6 — AB (ref 8.7–10.7)
GFR calc non Af Amer: 92.23

## 2020-01-17 LAB — LIPID PANEL
Cholesterol: 193 (ref 0–200)
HDL: 67 (ref 35–70)
LDL Cholesterol: 114
LDl/HDL Ratio: 2.9
Triglycerides: 62 (ref 40–160)

## 2020-01-17 LAB — HEMOGLOBIN A1C: Hemoglobin A1C: 5.8

## 2020-01-25 DIAGNOSIS — R05 Cough: Secondary | ICD-10-CM | POA: Diagnosis not present

## 2020-01-25 DIAGNOSIS — J0100 Acute maxillary sinusitis, unspecified: Secondary | ICD-10-CM | POA: Diagnosis not present

## 2020-01-25 DIAGNOSIS — J189 Pneumonia, unspecified organism: Secondary | ICD-10-CM | POA: Diagnosis not present

## 2020-01-26 ENCOUNTER — Ambulatory Visit: Payer: BC Managed Care – PPO | Admitting: Family Medicine

## 2020-02-01 NOTE — Patient Instructions (Addendum)
It was great to see you again today! I am sorry you are still not feeling great I will be in touch with your chest x-ray and will request your records from Dr Chalmers Cater I sent in an rx for atrovent nasal to use for post- nasal drop and also some cough syrup to use as needed. The cough syrup may cause drowsiness  Let's check a urine for protein as well

## 2020-02-01 NOTE — Progress Notes (Signed)
[facility] [place], [place], [place] [address] [phone]  Date:  02/05/2020   Name:  Maria White   DOB:  05/22/1966   MRN:  [id number]  PCP:  Darreld Mclean, MD    Chief Complaint: Pneumonia (follow up)   History of Present Illness:  Maria White is a 54 y.o. very pleasant female patient who presents with the following:  Here today for routine follow-up visit History of type 1 diabetes diagnosed at age 66, pituitary adenoma She does not use a pump- uses humalog and tresiba  Last seen by myself September 2020 Her gastroenterologist is Dr. Philmore Pali done in [place], NP- she just retired  Her endocrinologist is Dr. Chalmers Cater- she was seen a couple of weeks ago She does not check a urine protein per her knowledge A1c 5.7% at last visit  Hep C and HIV screening A1c per endocrinology  Urine micro albumin- due Eye exam- she will update COVID-19 vaccine- encouraged her to have this done  pneumonia vaccine Shingles vaccine M[place]-to-date Pap done in 2020 per chart Last tetanus- she is not sure, she thinks in the last decade  She is postmenopausal on hormone replacement therapy History of elevated prolactin level, has been low for more than 5 years per GYN notes  She was seen at urgent care on 7/1 with illness and had a chest x-ray, dx with pneumonia. She was not tested for covid 19 at that time  XR chest 2 views  Narrative  Findings: Frontal and lateral views of the chest, no prior studies. Patchy left lower lobe airspace disease without effusion. Right lung without acute consolidation or effusion. No pneumothorax. There is no pulmonary venous congestion or interstitial edema. The cardiomediastinal silhouette size is within normal limits. Impression: Left lower lobe opacity given the clinical history likely represents acute infectious process. Recommend follow-up after treatment to  ensure resolution.  She brings [place] a CD of her chest film today but I am unable to get it to load unfortunately  They treated her with zpack, augmentin, prednisone, inhaler  She felt like she was getting better, but over the weekend she noted more drainage and cough Today she feels fatigued but she was up coughing last night She feels like there is congestion in her throat  She does have albuterol which does help with her congestion Would like some cough syrup to help her sleep and to make sure her chest film has cleared up.  Patient Active Problem List   Diagnosis Date Noted   Tennis elbow 02/03/2011   Fibrocystic breast 22/63/3354   DM w/o complication type I ([facility]) 01/26/2011    Past Medical History:  Diagnosis Date   Anemia    resolved   Diabetes mellitus    age 27   Elevated prolactin level    Pituitary adenoma ([facility]) 9/11   5 mm    Past Surgical History:  Procedure Laterality Date   COLPOSCOPY     ENDOMETRIAL ABLATION  8/11   her option   LASER ABLATION OF THE CERVIX      Social History   Tobacco Use   Smoking status: Never Smoker   Smokeless tobacco: Never Used  Vaping Use   Vaping Use: Never used  Substance Use Topics   Alcohol use: No   Drug use: No    Family History  Problem Relation Age of Onset   Breast cancer Maternal Grandmother  63       and great grandmother   Hypertension Mother    Breast cancer Mother 68   Breast cancer Other    Colon cancer Neg Hx    Esophageal cancer Neg Hx    Rectal cancer Neg Hx    Stomach cancer Neg Hx     No Known Allergies  Medication list has been reviewed and updated.  Current Outpatient Medications on File Prior to Visit  Medication Sig Dispense Refill   glucose blood (ONE TOUCH ULTRA TEST) test strip Use as instructed test 8 to 10 times a day 700 each 3   insulin lispro (HUMALOG) 100 UNIT/ML injection Sliding scale 3 mL 3   Insulin Syringe-Needle U-100 (B-D INS SYR ULTRAFINE  .5CC/30G) 30G X 1/2" 0.5 ML MISC Use as directed to administer insulin 600 each 3   Multiple Vitamins-Minerals (MULTIVITAMIN ADULT PO) Take 1 tablet by mouth daily.     TRESIBA FLEXTOUCH 100 UNIT/ML SOPN FlexTouch Pen as directed.     No current facility-administered medications on file prior to visit.    Review of Systems:  As per HPI- otherwise negative.   Physical Examination: Vitals:   02/05/20 1436  BP: 124/70  Pulse: 76  Resp: 16  Temp: 98.2 F (36.8 C)  SpO2: 98%   Vitals:   02/05/20 1436  Weight: 142 lb (64.4 kg)  Height: 5\' 4"  (1.626 m)   Body mass index is 24.37 kg/m. Ideal Body Weight: Weight in (lb) to have BMI = 25: 145.3  GEN: no acute distress.  Normal weight, looks well  HEENT: Atraumatic, Normocephalic.   Bilateral TM wnl, oropharynx normal.  PEERL,EOMI.   Ears and Nose: No external deformity. CV: RRR, No M/G/R. No JVD. No thrill. No extra heart sounds. PULM: CTA B, minimal wheeze with cough only, no crackles, rhonchi. No retractions. No resp. distress. No accessory muscle use. ABD: S, NT, ND, +BS. No rebound. No HSM. EXTR: No c/c/e PSYCH: Normally interactive. Conversant.    Assessment and Plan: Community acquired pneumonia of left lower lobe of lung - Plan: HYDROcodone-homatropine (HYCODAN) 5-1.5 MG/5ML syrup, ipratropium (ATROVENT) 0.03 % nasal spray, DG Chest 2 View  DM w/o complication type I ([facility]) - Plan: Microalbumin / creatinine urine ratio  Following up with CAP today She is overall feeling better but still has come cough and bothersome nighttime PND Will treat with atrovent nasal and also hycodan syrup- cautioned regarding sedation Repeat chest film today Check urine microalbumin Request records from Dr Chalmers Cater re: other labs   This visit occurred during the SARS-CoV-2 public health emergency.  Safety protocols were in place, including screening questions prior to the visit, additional usage of staff PPE, and extensive cleaning of exam  room while observing appropriate contact time as indicated for disinfecting solutions.    Signed Lamar Blinks, MD  Received her chest film as below  DG Chest 2 View  Result Date: 02/05/2020 CLINICAL DATA:  Follow-up pneumonia EXAM: CHEST - 2 VIEW COMPARISON:  11/17/2017 FINDINGS: Heart and mediastinal contours are within normal limits. No focal opacities or effusions. No acute bony abnormality. IMPRESSION: Negative. Electronically Signed   By: Rolm Baptise M.D.   On: 02/05/2020 15:44    Message to pt

## 2020-02-05 ENCOUNTER — Ambulatory Visit (INDEPENDENT_AMBULATORY_CARE_PROVIDER_SITE_OTHER): Payer: BC Managed Care – PPO | Admitting: Family Medicine

## 2020-02-05 ENCOUNTER — Ambulatory Visit (HOSPITAL_BASED_OUTPATIENT_CLINIC_OR_DEPARTMENT_OTHER)
Admission: RE | Admit: 2020-02-05 | Discharge: 2020-02-05 | Disposition: A | Discharge status: Home / Self Care | Payer: BC Managed Care – PPO | Source: Ambulatory Visit | Attending: Family Medicine | Admitting: Family Medicine

## 2020-02-05 ENCOUNTER — Other Ambulatory Visit: Payer: Self-pay

## 2020-02-05 ENCOUNTER — Encounter: Payer: Self-pay | Admitting: Family Medicine

## 2020-02-05 VITALS — BP 124/70 | HR 76 | Temp 98.2°F | Resp 16 | Ht 64.0 in | Wt 142.0 lb

## 2020-02-05 DIAGNOSIS — J189 Pneumonia, unspecified organism: Secondary | ICD-10-CM | POA: Insufficient documentation

## 2020-02-05 DIAGNOSIS — E109 Type 1 diabetes mellitus without complications: Secondary | ICD-10-CM

## 2020-02-05 MED ORDER — HYDROCODONE-HOMATROPINE 5-1.5 MG/5ML PO SYRP: 5.0000 mL | ORAL_SOLUTION | Freq: Three times a day (TID) | ORAL | 0 refills | Status: AC | PRN

## 2020-02-05 MED ORDER — IPRATROPIUM BROMIDE 0.03 % NA SOLN: 2.0000 | Freq: Two times a day (BID) | NASAL | 12 refills | Status: DC

## 2020-02-06 ENCOUNTER — Encounter: Payer: Self-pay | Admitting: Family Medicine

## 2020-02-06 LAB — MICROALBUMIN / CREATININE URINE RATIO
Creatinine,U: 25.6 mg/dL
Microalb Creat Ratio: 2.7 mg/g (ref 0.0–30.0)
Microalb, Ur: <0.7 mg/dL (ref 0.0–1.9)

## 2020-02-07 ENCOUNTER — Encounter: Payer: Self-pay | Admitting: Family Medicine

## 2020-02-07 LAB — PROLACTIN: Prolactin: 36.6

## 2020-06-27 DIAGNOSIS — H3561 Retinal hemorrhage, right eye: Secondary | ICD-10-CM | POA: Diagnosis not present

## 2020-06-27 DIAGNOSIS — H2513 Age-related nuclear cataract, bilateral: Secondary | ICD-10-CM | POA: Diagnosis not present

## 2020-06-27 DIAGNOSIS — H43813 Vitreous degeneration, bilateral: Secondary | ICD-10-CM | POA: Diagnosis not present

## 2020-06-27 DIAGNOSIS — E103293 Type 1 diabetes mellitus with mild nonproliferative diabetic retinopathy without macular edema, bilateral: Secondary | ICD-10-CM | POA: Diagnosis not present

## 2020-06-27 LAB — HM DIABETES EYE EXAM

## 2020-07-10 DIAGNOSIS — E109 Type 1 diabetes mellitus without complications: Secondary | ICD-10-CM | POA: Diagnosis not present

## 2020-07-10 DIAGNOSIS — E02 Subclinical iodine-deficiency hypothyroidism: Secondary | ICD-10-CM | POA: Diagnosis not present

## 2020-07-10 DIAGNOSIS — E221 Hyperprolactinemia: Secondary | ICD-10-CM | POA: Diagnosis not present

## 2020-07-10 DIAGNOSIS — E7800 Pure hypercholesterolemia, unspecified: Secondary | ICD-10-CM | POA: Diagnosis not present

## 2020-07-10 LAB — BASIC METABOLIC PANEL
BUN: 15 (ref 4–21)
CO2: 27 — AB (ref 13–22)
Chloride: 105 (ref 99–108)
Creatinine: 0.7 (ref 0.5–1.1)
Glucose: 178
Potassium: 4.5 (ref 3.4–5.3)
Sodium: 144 (ref 137–147)

## 2020-07-10 LAB — TSH: TSH: 2.52 (ref 0.41–5.90)

## 2020-07-10 LAB — COMPREHENSIVE METABOLIC PANEL WITH GFR: Calcium: 9 (ref 8.7–10.7)

## 2020-07-17 DIAGNOSIS — D443 Neoplasm of uncertain behavior of pituitary gland: Secondary | ICD-10-CM | POA: Diagnosis not present

## 2020-07-17 DIAGNOSIS — E221 Hyperprolactinemia: Secondary | ICD-10-CM | POA: Diagnosis not present

## 2020-07-17 DIAGNOSIS — E02 Subclinical iodine-deficiency hypothyroidism: Secondary | ICD-10-CM | POA: Diagnosis not present

## 2020-07-17 DIAGNOSIS — E109 Type 1 diabetes mellitus without complications: Secondary | ICD-10-CM | POA: Diagnosis not present

## 2020-09-01 DIAGNOSIS — R0981 Nasal congestion: Secondary | ICD-10-CM | POA: Diagnosis not present

## 2020-09-01 DIAGNOSIS — H6983 Other specified disorders of Eustachian tube, bilateral: Secondary | ICD-10-CM | POA: Diagnosis not present

## 2020-09-07 DIAGNOSIS — H66003 Acute suppurative otitis media without spontaneous rupture of ear drum, bilateral: Secondary | ICD-10-CM | POA: Diagnosis not present

## 2020-09-11 ENCOUNTER — Other Ambulatory Visit: Payer: Self-pay | Admitting: Nurse Practitioner

## 2020-09-11 DIAGNOSIS — Z1231 Encounter for screening mammogram for malignant neoplasm of breast: Secondary | ICD-10-CM

## 2020-11-01 ENCOUNTER — Other Ambulatory Visit: Payer: Self-pay

## 2020-11-01 ENCOUNTER — Ambulatory Visit
Admission: RE | Admit: 2020-11-01 | Discharge: 2020-11-01 | Disposition: A | Discharge status: Home / Self Care | Payer: BC Managed Care – PPO | Source: Ambulatory Visit | Attending: Nurse Practitioner | Admitting: Nurse Practitioner

## 2020-11-01 DIAGNOSIS — Z1231 Encounter for screening mammogram for malignant neoplasm of breast: Secondary | ICD-10-CM

## 2021-01-07 DIAGNOSIS — E02 Subclinical iodine-deficiency hypothyroidism: Secondary | ICD-10-CM | POA: Diagnosis not present

## 2021-01-07 DIAGNOSIS — E221 Hyperprolactinemia: Secondary | ICD-10-CM | POA: Diagnosis not present

## 2021-01-07 DIAGNOSIS — E109 Type 1 diabetes mellitus without complications: Secondary | ICD-10-CM | POA: Diagnosis not present

## 2021-01-07 LAB — LIPID PANEL
Cholesterol: 211 — AB (ref 0–200)
HDL: 74 — AB (ref 35–70)
LDL Cholesterol: 120
LDl/HDL Ratio: 1.6
Triglycerides: 84 (ref 40–160)

## 2021-01-07 LAB — TSH: TSH: 3.43 (ref <=5.90)

## 2021-01-14 DIAGNOSIS — D443 Neoplasm of uncertain behavior of pituitary gland: Secondary | ICD-10-CM | POA: Diagnosis not present

## 2021-01-14 DIAGNOSIS — E109 Type 1 diabetes mellitus without complications: Secondary | ICD-10-CM | POA: Diagnosis not present

## 2021-01-14 DIAGNOSIS — E221 Hyperprolactinemia: Secondary | ICD-10-CM | POA: Diagnosis not present

## 2021-01-14 DIAGNOSIS — E02 Subclinical iodine-deficiency hypothyroidism: Secondary | ICD-10-CM | POA: Diagnosis not present

## 2021-01-14 LAB — LIPID PANEL
Cholesterol: 211 — AB (ref 0–200)
HDL: 74 — AB (ref 35–70)
LDL Cholesterol: 120
Triglycerides: 84 (ref 40–160)

## 2021-01-14 LAB — HEMOGLOBIN A1C: Hemoglobin A1C: 6

## 2021-01-14 LAB — TSH: TSH: 3.43 (ref 0.41–5.90)

## 2021-02-10 ENCOUNTER — Other Ambulatory Visit: Payer: Self-pay

## 2021-02-10 ENCOUNTER — Encounter: Payer: Self-pay | Admitting: Family Medicine

## 2021-02-10 ENCOUNTER — Ambulatory Visit (INDEPENDENT_AMBULATORY_CARE_PROVIDER_SITE_OTHER): Payer: BC Managed Care – PPO | Admitting: Family Medicine

## 2021-02-10 VITALS — BP 130/80 | HR 85 | Temp 99.5°F | Ht 64.5 in | Wt 139.5 lb

## 2021-02-10 DIAGNOSIS — W57XXXA Bitten or stung by nonvenomous insect and other nonvenomous arthropods, initial encounter: Secondary | ICD-10-CM | POA: Diagnosis not present

## 2021-02-10 DIAGNOSIS — S80869A Insect bite (nonvenomous), unspecified lower leg, initial encounter: Secondary | ICD-10-CM | POA: Diagnosis not present

## 2021-02-10 DIAGNOSIS — L299 Pruritus, unspecified: Secondary | ICD-10-CM | POA: Diagnosis not present

## 2021-02-10 MED ORDER — HYDROXYZINE PAMOATE 25 MG PO CAPS: 25.0000 mg | ORAL_CAPSULE | Freq: Three times a day (TID) | ORAL | 0 refills | Status: DC | PRN

## 2021-02-10 MED ORDER — METHYLPREDNISOLONE ACETATE 80 MG/ML IJ SUSP
80.0000 mg | Freq: Once | INTRAMUSCULAR | Status: AC
  Administered 2021-02-10: 80 mg via INTRAMUSCULAR

## 2021-02-10 MED ORDER — PREDNISONE 20 MG PO TABS: 40.0000 mg | ORAL_TABLET | Freq: Every day | ORAL | 0 refills | Status: AC

## 2021-02-10 NOTE — Patient Instructions (Signed)
Try not to itch. Avoid scented products while you are going through this.  Ice/cold pack over area for 10-15 min twice daily.  Continue the Zyrtec equivalent.  The hydroxyzine can make you sleepy.  The steroids can make your sugar elevated.  Let us know if you need anything.

## 2021-02-10 NOTE — Progress Notes (Signed)
Chief Complaint  Patient presents with   Insect Bite    Maria White is a 55 y.o. female here for a skin complaint.  Duration: 1 week Location: legs Pruritic? Yes Painful? No Drainage? No New soaps/lotions/topicals/detergents? No Sick contacts? No Other associated symptoms: She feels that it is spreading up her legs Therapies tried thus far: Benadryl cream, topical cortisol cream  Past Medical History:  Diagnosis Date   Anemia    resolved   Diabetes mellitus    age 26   Elevated prolactin level    Pituitary adenoma ([facility]) 9/11   5 mm    BP 130/80   Pulse 85   Temp 99.5 F (37.5 C) (Oral)   Ht 5' 4.5" (1.638 m)   Wt 139 lb 8 oz (63.3 kg)   SpO2 97%   BMI 23.58 kg/m  Gen: awake, alert, appearing stated age Lungs: No accessory muscle use Skin: Pinkish papules over the lower extremities mainly anteriorly. No drainage, erythema, TTP, fluctuance, excoriation Psych: Age appropriate judgment and insight  Bug bite, initial encounter - Plan: hydrOXYzine (VISTARIL) 25 MG capsule, predniSONE (DELTASONE) 20 MG tablet, methylPREDNISolone acetate (DEPO-MEDROL) injection 80 mg  Pruritus - Plan: hydrOXYzine (VISTARIL) 25 MG capsule, predniSONE (DELTASONE) 20 MG tablet, methylPREDNISolone acetate (DEPO-MEDROL) injection 80 mg  Depo injection today, 5-day prednisone burst 40 mg daily starting tomorrow.  Vistaril as needed for itching.  Avoid scented products.  Try not to scratch.  Continue daily antihistamine which he initiated today.  Ice/cold can be helpful as well.  She is a type I diabetic and will closely monitor her sugars. F/u prn. The patient voiced understanding and agreement to the plan.  [place], DO 02/10/21 2:44 PM

## 2021-02-19 ENCOUNTER — Encounter: Payer: Self-pay | Admitting: Internal Medicine

## 2021-02-19 ENCOUNTER — Ambulatory Visit (INDEPENDENT_AMBULATORY_CARE_PROVIDER_SITE_OTHER): Payer: BC Managed Care – PPO | Admitting: Internal Medicine

## 2021-02-19 ENCOUNTER — Other Ambulatory Visit: Payer: Self-pay

## 2021-02-19 VITALS — BP 106/66 | HR 83 | Temp 98.4°F | Resp 16 | Ht 64.5 in | Wt 138.2 lb

## 2021-02-19 DIAGNOSIS — T7840XD Allergy, unspecified, subsequent encounter: Secondary | ICD-10-CM | POA: Diagnosis not present

## 2021-02-19 DIAGNOSIS — W57XXXD Bitten or stung by nonvenomous insect and other nonvenomous arthropods, subsequent encounter: Secondary | ICD-10-CM | POA: Diagnosis not present

## 2021-02-19 MED ORDER — PREDNISONE 10 MG PO TABS: ORAL_TABLET | ORAL | 0 refills | Status: DC

## 2021-02-19 MED ORDER — HYDROCORTISONE 2.5 % EX CREA: TOPICAL_CREAM | Freq: Two times a day (BID) | CUTANEOUS | 1 refills | Status: DC

## 2021-02-19 NOTE — Patient Instructions (Addendum)
Zyrtec 10 mg daily for few days  Pepcid 20 mg over-the-counter: 1 tablet twice a day for few days  Hydrocortisone 2.5% twice daily as needed  Prednisone for few days, increase your insulin as needed  Hydroxyzine once daily as needed  Avoid further exposure

## 2021-02-19 NOTE — Progress Notes (Signed)
Subjective:    Patient ID: Maria White, female    DOB: 09-17-1965, 54 y.o.   MRN: [id number]  DOS:  02/19/2021 Type of visit - description: Acute  Was seen at this office about 10 days ago with similar symptoms after she was doing some work at the yard.  Receive steroids and felt better.  She went out to the yard again 4 days ago and symptoms resurface: She has very small bumps from the knee down in both legs, they are extremely pruritic.  No fever chills No arthralgias  Review of Systems See above   Past Medical History:  Diagnosis Date   Anemia    resolved   Diabetes mellitus    age 67   Elevated prolactin level    Pituitary adenoma ([facility]) 9/11   5 mm    Past Surgical History:  Procedure Laterality Date   COLPOSCOPY     ENDOMETRIAL ABLATION  8/11   her option   LASER ABLATION OF THE CERVIX      Allergies as of 02/19/2021   No Known Allergies      Medication List        Accurate as of February 19, 2021  2:15 PM. If you have any questions, ask your nurse or doctor.          glucose blood test strip Commonly known as: ONE TOUCH ULTRA TEST Use as instructed test 8 to 10 times a day   hydrOXYzine 25 MG capsule Commonly known as: VISTARIL Take 1 capsule (25 mg total) by mouth every 8 (eight) hours as needed.   insulin lispro 100 UNIT/ML injection Commonly known as: HumaLOG Sliding scale   Insulin Syringe-Needle U-100 30G X 1/2" 0.5 ML Misc Commonly known as: B-D INS SYR ULTRAFINE .5CC/30G Use as directed to administer insulin   ipratropium 0.03 % nasal spray Commonly known as: ATROVENT Place 2 sprays into both nostrils every 12 (twelve) hours.   MULTIVITAMIN ADULT PO Take 1 tablet by mouth daily.   Tyler Aas FlexTouch 100 UNIT/ML FlexTouch Pen Generic drug: insulin degludec as directed.           Objective:   Physical Exam BP 106/66 (BP Location: Left Arm, Patient Position: Sitting, Cuff Size: Small)   Pulse 83   Temp 98.4 F (36.9 C)  (Oral)   Resp 16   Ht 5' 4.5" (1.638 m)   Wt 138 lb 4 oz (62.7 kg)   SpO2 97%   BMI 23.36 kg/m  General:   Well developed, NAD, BMI noted. HEENT:  Normocephalic . Face symmetric, atraumatic Lower extremities: no pretibial edema bilaterally.  She has superficial is 1 or 2 mm bumps distributed along both legs distally from the knee.  They are not red they are simply elevated. Neurologic:  alert & oriented X3.  Speech normal, gait appropriate for age and unassisted Psych--  Cognition and judgment appear intact.  Cooperative with normal attention span and concentration.  Behavior appropriate. No anxious or depressed appearing.      Assessment   55 year old female, PMH includes diabetes, pituitary adenoma, endometrial ablation, presents with:  Allergic reaction: Likely related to something she caught while at the yard; she was treated successfully with prednisone 10 days ago but symptoms resurface after she was reexposed. She manages her diabetes really well and although prednisone increase sugars she simply uses more insulin. Plan: Zyrtec, hydroxyzine, topical hydrocortisone, second round of prednisone oral, Pepcid, avoidance, call if no better.  See AVS  This visit occurred during the SARS-CoV-2 public health emergency.  Safety protocols were in place, including screening questions prior to the visit, additional usage of staff PPE, and extensive cleaning of exam room while observing appropriate contact time as indicated for disinfecting solutions.

## 2021-02-20 ENCOUNTER — Encounter: Payer: Self-pay | Admitting: Family Medicine

## 2021-02-20 NOTE — Progress Notes (Signed)
Cholesterol / HDL Ratio is 2.85.  Labs abstracted from Dr. Mindi Curling office

## 2021-02-22 IMAGING — MG DIGITAL DIAGNOSTIC UNILATERAL LEFT MAMMOGRAM WITH TOMO AND CAD
6 series · 6 of 18 positions shown · non-contrast
Comparison: Previous exam(s).

CLINICAL DATA: 52-year-old with focal pain involving the UPPER
OUTER QUADRANT of the LEFT breast which has occurred intermittently
over the past approximate 1 year.

Family history of breast cancer in her mother at age 66, in her
maternal grandmother at age 60 and in her maternal great
grandmother.
EXAM:
DIGITAL DIAGNOSTIC LEFT MAMMOGRAM WITH CAD AND TOMO
ULTRASOUND LEFT BREAST

[L CC synth-2D]
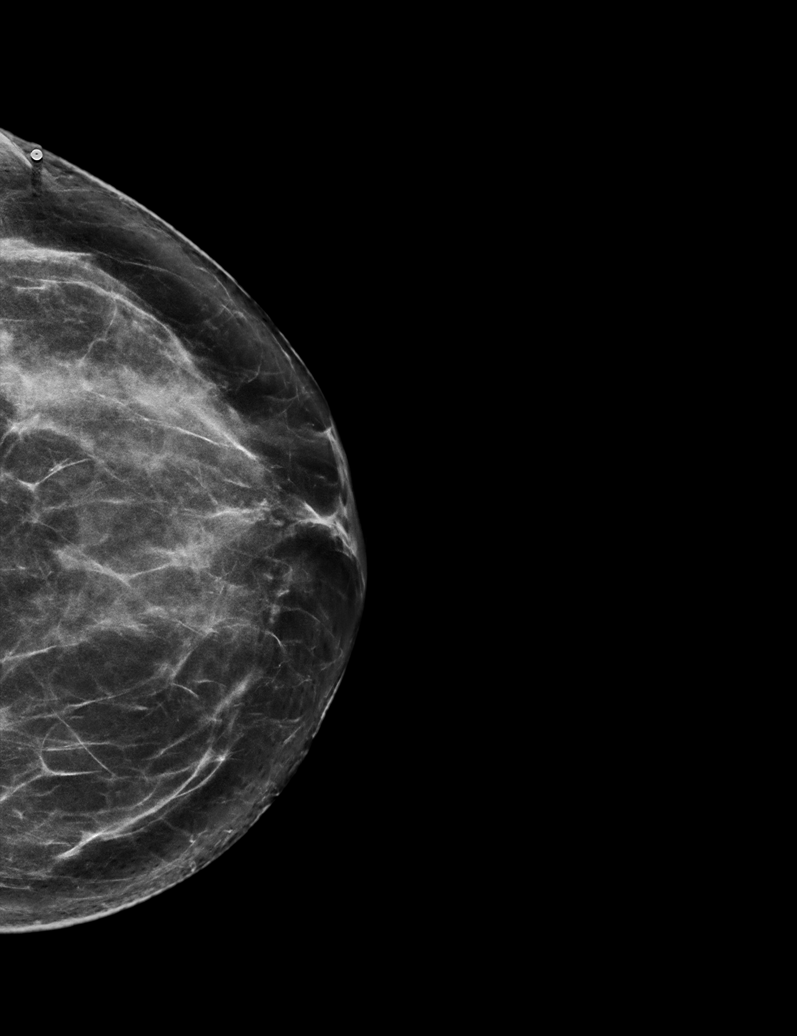

[L XCCL synth-2D]
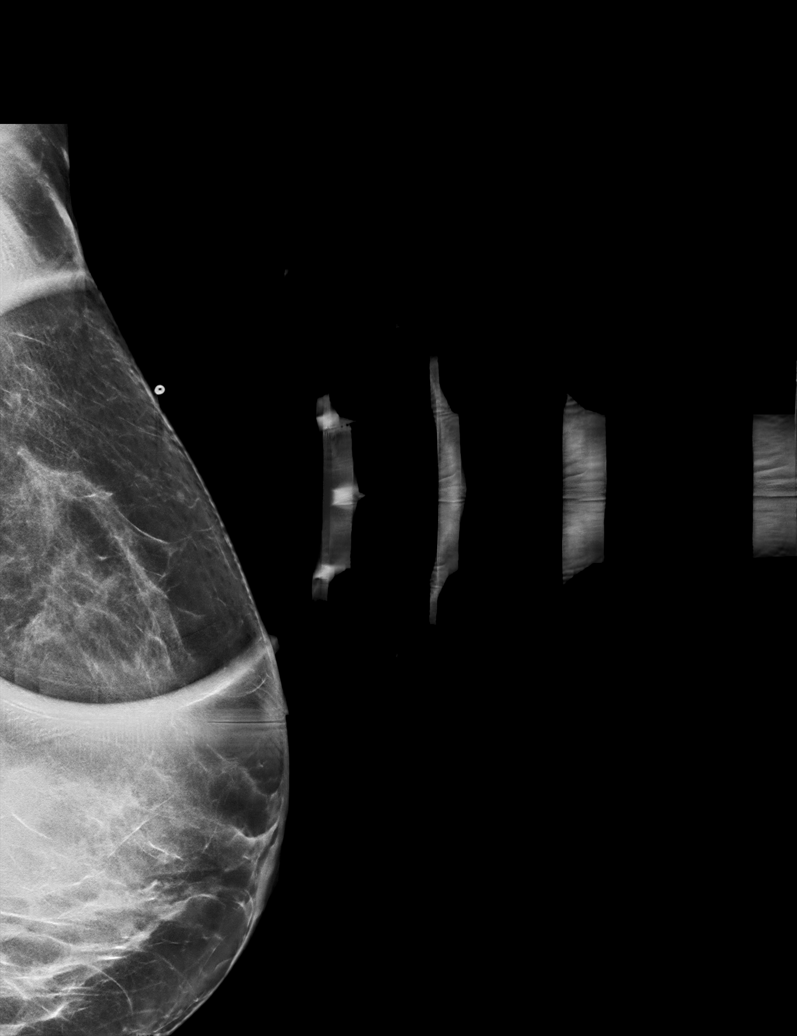

[L MLO synth-2D]
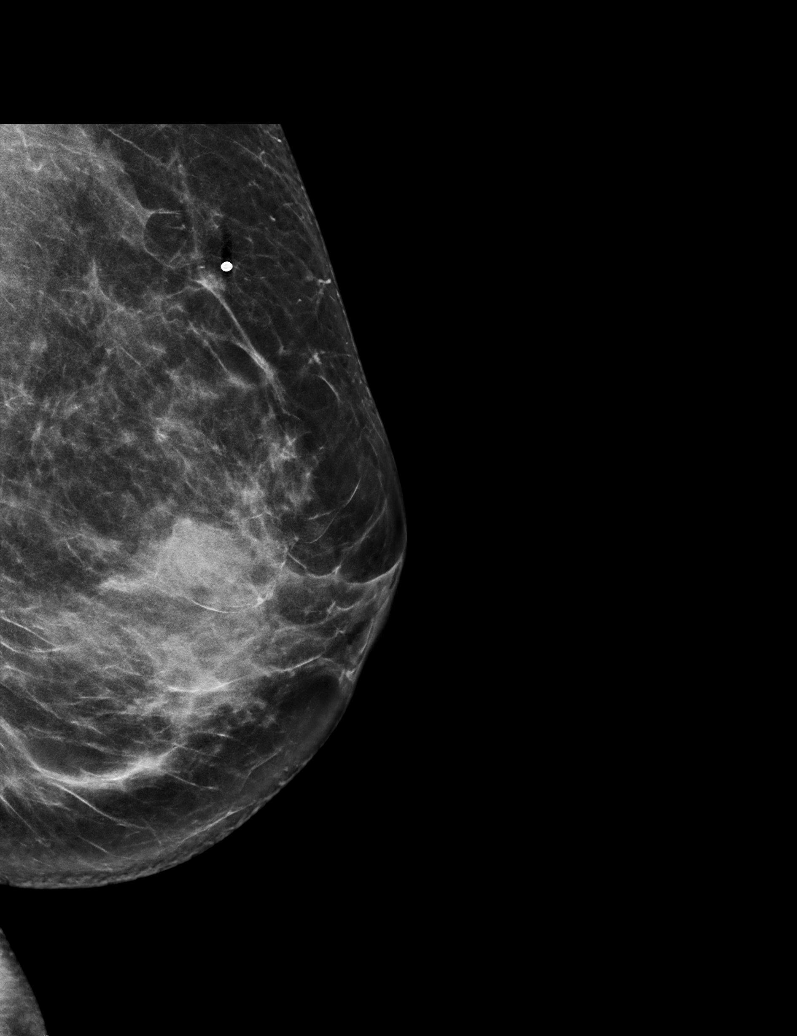

[L CC tomo · tomo slice 36/71.0]
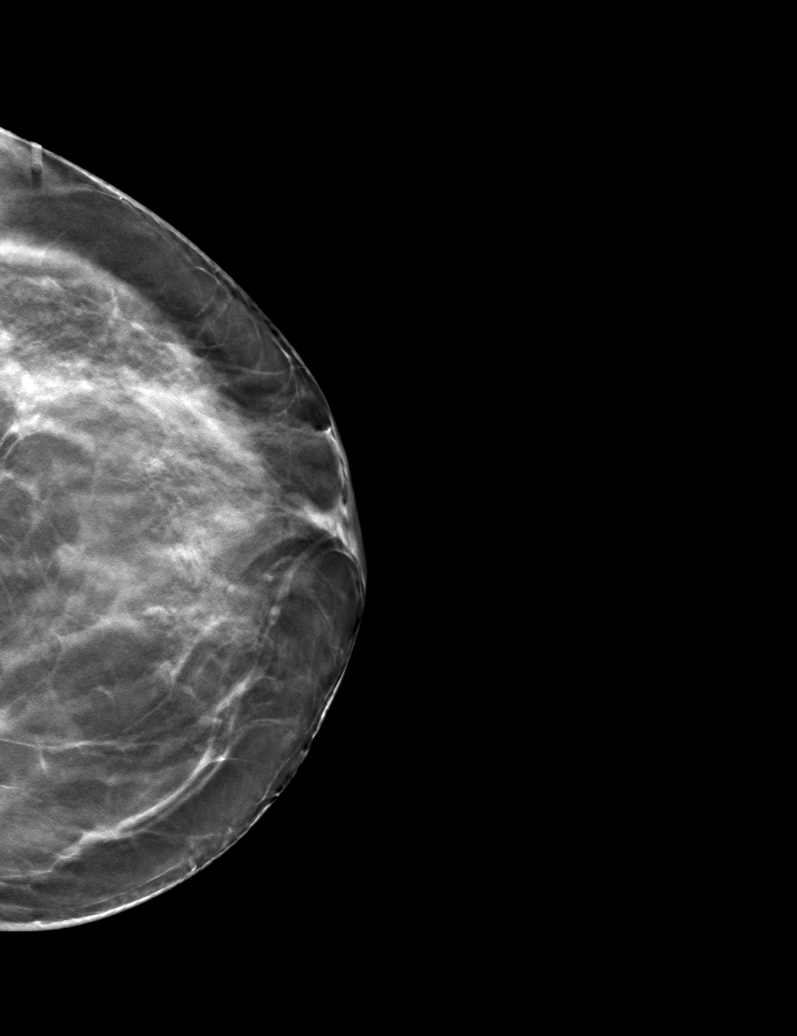

[L MLO tomo · tomo slice 35/70.0]
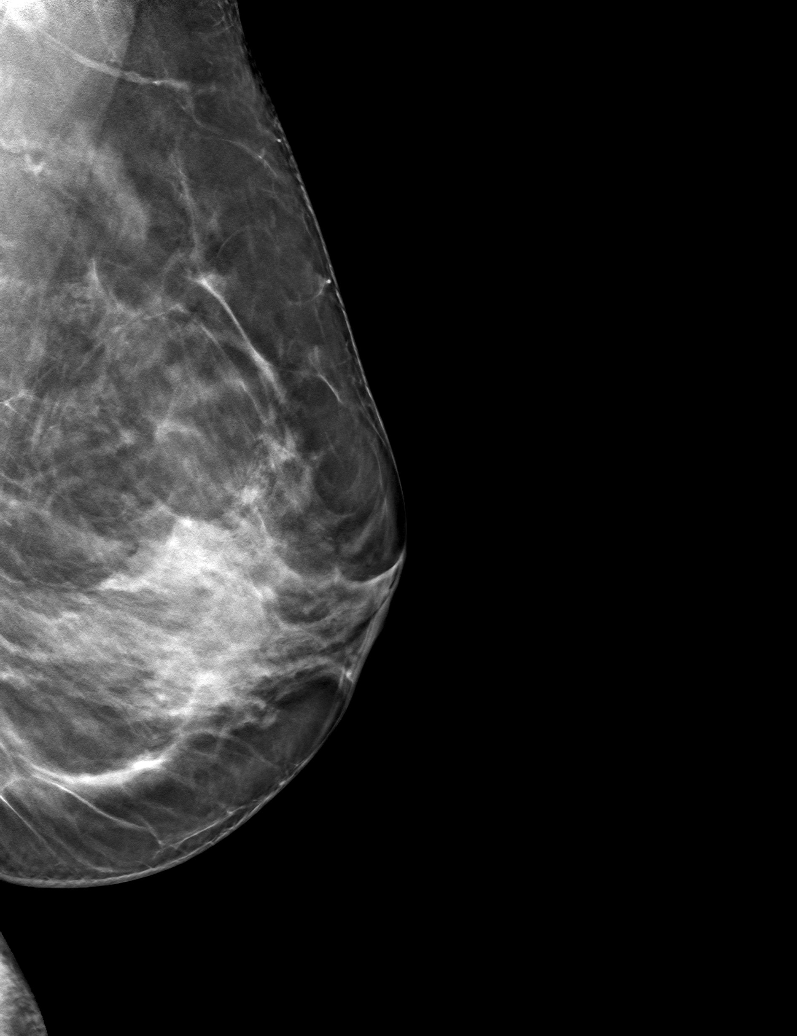

[L XCCL tomo · tomo slice 37/72.0]
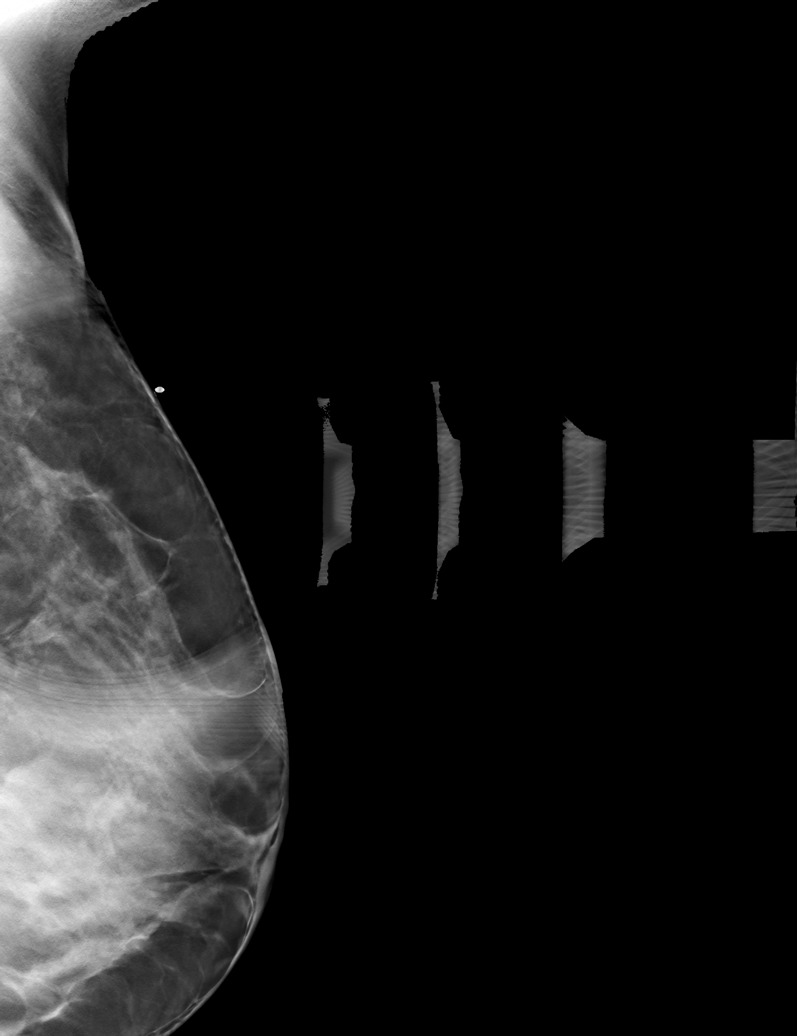

[6 of 18 positions shown; findings below may reference images not displayed]

ACR Breast Density Category c: The breast tissue is heterogeneously
dense, which may obscure small masses.
FINDINGS: Tomosynthesis and synthesized full field CC and MLO views of the
LEFT breast were obtained. Tomosynthesis and synthesized spot
compression tangential view of the area of concern in the LEFT
breast was also obtained.

No mammographic abnormality in the area of focal pain and tenderness
in the UPPER OUTER QUADRANT at POSTERIOR depth. Normal
fibroglandular tissue is present in this location and a normal
appearing approximate 4 mm lymph is present deep to the area
palpable concern.

No findings suspicious for malignancy in the LEFT breast.

Mammographic images were processed with CAD.

On correlative physical exam, there is no palpable abnormality in
the UPPER OUTER LEFT breast in the area focal pain and tenderness.

Targeted LEFT breast ultrasound is performed, showing normal
fibroglandular tissue in the UPPER OUTER QUADRANT and a normal
appearing approximate 6 mm intramammary lymph node at the 2 o'clock
position approximately 5 cm from the nipple at POSTERIOR depth. No
suspicious solid mass or abnormal acoustic shadowing is identified.
IMPRESSION: No mammographic or sonographic evidence of malignancy involving the
LEFT breast.

RECOMMENDATION:
Annual BILATERAL screening mammography which is due in late [REDACTED]
or September 2019.

I have discussed the findings and recommendations with the patient.
If applicable, a reminder letter will be sent to the patient
regarding the next appointment.

BI-RADS CATEGORY  2: Benign.

## 2021-02-26 ENCOUNTER — Encounter: Payer: Self-pay | Admitting: Family Medicine

## 2021-02-26 DIAGNOSIS — E113299 Type 2 diabetes mellitus with mild nonproliferative diabetic retinopathy without macular edema, unspecified eye: Secondary | ICD-10-CM | POA: Insufficient documentation

## 2021-02-27 ENCOUNTER — Encounter: Payer: Self-pay | Admitting: Family Medicine

## 2021-03-02 ENCOUNTER — Encounter: Payer: Self-pay | Admitting: Internal Medicine

## 2021-03-03 ENCOUNTER — Other Ambulatory Visit: Payer: Self-pay | Admitting: Internal Medicine

## 2021-03-03 ENCOUNTER — Ambulatory Visit (INDEPENDENT_AMBULATORY_CARE_PROVIDER_SITE_OTHER): Payer: BC Managed Care – PPO | Admitting: Nurse Practitioner

## 2021-03-03 ENCOUNTER — Encounter: Payer: Self-pay | Admitting: Nurse Practitioner

## 2021-03-03 ENCOUNTER — Other Ambulatory Visit: Payer: Self-pay

## 2021-03-03 VITALS — BP 118/76

## 2021-03-03 DIAGNOSIS — N9089 Other specified noninflammatory disorders of vulva and perineum: Secondary | ICD-10-CM | POA: Diagnosis not present

## 2021-03-03 MED ORDER — BETAMETHASONE DIPROPIONATE AUG 0.05 % EX CREA: TOPICAL_CREAM | Freq: Two times a day (BID) | CUTANEOUS | 0 refills | Status: DC

## 2021-03-03 NOTE — Progress Notes (Signed)
   Subjective:    Patient ID: Maria White, female    DOB: 28-Apr-1966, 55 y.o.   MRN: [id number]  DOS:  03/03/2021 Type of visit - description:     Review of Systems See above   Past Medical History:  Diagnosis Date   Anemia    resolved   Diabetes mellitus    age 10   Elevated prolactin level    Pituitary adenoma ([place]) 9/11   5 mm    Past Surgical History:  Procedure Laterality Date   COLPOSCOPY     ENDOMETRIAL ABLATION  8/11   her option   LASER ABLATION OF THE CERVIX      Allergies as of 03/03/2021   No Known Allergies      Medication List        Accurate as of March 03, 2021  9:17 AM. If you have any questions, ask your nurse or doctor.          augmented betamethasone dipropionate 0.05 % cream Commonly known as: DIPROLENE-AF Apply topically 2 (two) times daily. Started by: Kathlene November, MD   glucose blood test strip Commonly known as: ONE TOUCH ULTRA TEST Use as instructed test 8 to 10 times a day   hydrocortisone 2.5 % cream Apply topically 2 (two) times daily.   hydrOXYzine 25 MG capsule Commonly known as: VISTARIL Take 1 capsule (25 mg total) by mouth every 8 (eight) hours as needed.   insulin lispro 100 UNIT/ML injection Commonly known as: HumaLOG Sliding scale   Insulin Syringe-Needle U-100 30G X 1/2" 0.5 ML Misc Commonly known as: B-D INS SYR ULTRAFINE .5CC/30G Use as directed to administer insulin   ipratropium 0.03 % nasal spray Commonly known as: ATROVENT Place 2 sprays into both nostrils every 12 (twelve) hours.   MULTIVITAMIN ADULT PO Take 1 tablet by mouth daily.   predniSONE 10 MG tablet Commonly known as: DELTASONE 3 tabs x 3 days, 2 tabs x 3 days, 1 tab x 3 days   Tresiba FlexTouch 100 UNIT/ML FlexTouch Pen Generic drug: insulin degludec as directed.           Objective:   Physical Exam There were no vitals taken for this visit.     Assessment        This visit occurred during the SARS-CoV-2 public  health emergency.  Safety protocols were in place, including screening questions prior to the visit, additional usage of staff PPE, and extensive cleaning of exam room while observing appropriate contact time as indicated for disinfecting solutions.

## 2021-03-03 NOTE — Progress Notes (Signed)
   Acute Office Visit  Subjective:    Patient ID: Maria White, female    DOB: [birth year], 55 y.o.   MRN: [id number]   HPI 55 y.o. presents today for vulvar bump that she first saw herself 3 days ago. Her husband had mentioned a bump a while back and she is not sure if it is the same one. She only looked because she saw a small red dot on her underwear. It is painless and without redness or drainage.    Review of Systems  Constitutional: Negative.   Genitourinary:        Bump on left labia      Objective:    Physical Exam Constitutional:      Appearance: Normal appearance.  Genitourinary:     BP 118/76  Wt Readings from Last 3 Encounters:  02/19/21 138 lb 4 oz (62.7 kg)  02/10/21 139 lb 8 oz (63.3 kg)  02/05/20 142 lb (64.4 kg)        Assessment & Plan:   Problem List Items Addressed This Visit   None Visit Diagnoses     Vulvar lesion    -  Primary      Plan: She is unsure if lesion has changed since she only saw it a few days ago. Recommend biopsy. She has anniversary trip in 2 days and would like to reschedule when she gets back on Friday.      Tamela Gammon DNP, 10:57 AM 03/03/2021

## 2021-03-04 ENCOUNTER — Other Ambulatory Visit: Payer: Self-pay | Admitting: Nurse Practitioner

## 2021-03-04 DIAGNOSIS — N9089 Other specified noninflammatory disorders of vulva and perineum: Secondary | ICD-10-CM

## 2021-03-07 ENCOUNTER — Other Ambulatory Visit: Payer: Self-pay

## 2021-03-07 ENCOUNTER — Ambulatory Visit (INDEPENDENT_AMBULATORY_CARE_PROVIDER_SITE_OTHER): Payer: BC Managed Care – PPO | Admitting: Obstetrics and Gynecology

## 2021-03-07 ENCOUNTER — Encounter: Payer: Self-pay | Admitting: Obstetrics and Gynecology

## 2021-03-07 DIAGNOSIS — N9089 Other specified noninflammatory disorders of vulva and perineum: Secondary | ICD-10-CM

## 2021-03-07 DIAGNOSIS — D239 Other benign neoplasm of skin, unspecified: Secondary | ICD-10-CM | POA: Diagnosis not present

## 2021-03-07 NOTE — Progress Notes (Signed)
GYNECOLOGY  VISIT   HPI: 55 y.o.   Married White or Caucasian Not Hispanic or Latino  female   [phone] with No LMP recorded. Patient has had an ablation.   here for vulvar Bx Per Marny Lowenstein NP Left vulvar leasion.   About a month ago her husband told her she had a bump on her left vulva. She had a drop of blood on her underwear, but the drop is near the mons, thinks she may have cut herself shaving. No bleeding from her vagina or from the bump. No itching or discomfort.   GYNECOLOGIC HISTORY: No LMP recorded. Patient has had an ablation. Contraception:Ablation  Menopausal hormone therapy: none         OB History     Gravida  3   Para  2   Term  2   Preterm      AB  1   Living  2      SAB  1   IAB      Ectopic      Multiple      Live Births                 Patient Active Problem List   Diagnosis Date Noted   Nonproliferative diabetic retinopathy ([place]) 02/26/2021   Tennis elbow 02/03/2011   Fibrocystic breast [id number]   DM w/o complication type I ([place]) 01/26/2011    Past Medical History:  Diagnosis Date   Anemia    resolved   Diabetes mellitus    age 25   Elevated prolactin level    Pituitary adenoma ([place]) 9/11   5 mm    Past Surgical History:  Procedure Laterality Date   COLPOSCOPY     ENDOMETRIAL ABLATION  8/11   her option   LASER ABLATION OF THE CERVIX      Current Outpatient Medications  Medication Sig Dispense Refill   augmented betamethasone dipropionate (DIPROLENE-AF) 0.05 % cream Apply topically 2 (two) times daily. 60 g 0   Cetirizine HCl (ZYRTEC PO) Take by mouth.     glucose blood (ONE TOUCH ULTRA TEST) test strip Use as instructed test 8 to 10 times a day 700 each 3   hydrocortisone 2.5 % cream Apply topically 2 (two) times daily. 30 g 1   hydrOXYzine (VISTARIL) 25 MG capsule Take 1 capsule (25 mg total) by mouth every 8 (eight) hours as needed. 30 capsule 0   insulin lispro (HUMALOG) 100 UNIT/ML injection Sliding scale  3 mL 3   Insulin Syringe-Needle U-100 (B-D INS SYR ULTRAFINE .5CC/30G) 30G X 1/2" 0.5 ML MISC Use as directed to administer insulin 600 each 3   Multiple Vitamins-Minerals (MULTIVITAMIN ADULT PO) Take 1 tablet by mouth daily.     TRESIBA FLEXTOUCH 100 UNIT/ML SOPN FlexTouch Pen as directed.     No current facility-administered medications for this visit.     ALLERGIES: Patient has no known allergies.  Family History  Problem Relation Age of Onset   Breast cancer Maternal Grandmother 74       and great grandmother   Hypertension Mother    Breast cancer Mother 40   Breast cancer Other    Colon cancer Neg Hx    Esophageal cancer Neg Hx    Rectal cancer Neg Hx    Stomach cancer Neg Hx     Social History   Socioeconomic History   Marital status: Married    Spouse name: Not on file   Number of  children: Not on file   Years of education: Not on file   Highest education level: Not on file  Occupational History   Not on file  Tobacco Use   Smoking status: Never   Smokeless tobacco: Never  Vaping Use   Vaping Use: Never used  Substance and Sexual Activity   Alcohol use: No   Drug use: No   Sexual activity: Yes    Birth control/protection: Other-see comments    Comment: Vasectomy  Other Topics Concern   Not on file  Social History Narrative   Not on file   Social Determinants of Health   Financial Resource Strain: Not on file  Food Insecurity: Not on file  Transportation Needs: Not on file  Physical Activity: Not on file  Stress: Not on file  Social Connections: Not on file  Intimate Partner Violence: Not on file    Review of Systems  All other systems reviewed and are negative.  PHYSICAL EXAMINATION:    There were no vitals taken for this visit.    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 No concerning changes.                Urethra:  normal appearing urethra  with no masses, tenderness or lesions              Bartholins and Skenes: normal                  Chaperone was present for exam.   1. Angiokeratoma Benign appearing, not bothersome.  Patient reassured No biopsy at this time.   CC: Marny Lowenstein, NP
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
## 2021-03-26 ENCOUNTER — Encounter: Payer: Self-pay | Admitting: Family Medicine

## 2021-03-26 ENCOUNTER — Other Ambulatory Visit: Payer: Self-pay

## 2021-03-26 ENCOUNTER — Ambulatory Visit (INDEPENDENT_AMBULATORY_CARE_PROVIDER_SITE_OTHER): Payer: BC Managed Care – PPO | Admitting: Family Medicine

## 2021-03-26 VITALS — BP 118/72 | HR 76 | Temp 97.9°F | Resp 17 | Ht 64.5 in | Wt 141.0 lb

## 2021-03-26 DIAGNOSIS — L299 Pruritus, unspecified: Secondary | ICD-10-CM

## 2021-03-26 DIAGNOSIS — E109 Type 1 diabetes mellitus without complications: Secondary | ICD-10-CM | POA: Diagnosis not present

## 2021-03-26 DIAGNOSIS — R5383 Other fatigue: Secondary | ICD-10-CM | POA: Diagnosis not present

## 2021-03-26 NOTE — Patient Instructions (Addendum)
Good to see you again today! I will be in touch with your labs asap If anything is changing or getting worse in the meantime please contact me

## 2021-03-27 ENCOUNTER — Encounter: Payer: Self-pay | Admitting: Family Medicine

## 2021-03-27 LAB — SEDIMENTATION RATE: Sed Rate: 9 mm/hr (ref 0–30)

## 2021-03-27 LAB — VITAMIN D 25 HYDROXY (VIT D DEFICIENCY, FRACTURES): VITD: 35.66 ng/mL (ref 30.00–100.00)

## 2021-03-27 LAB — COMPREHENSIVE METABOLIC PANEL
ALT: 12 U/L (ref 0–35)
AST: 14 U/L (ref 0–37)
Albumin: 4.3 g/dL (ref 3.5–5.2)
Alkaline Phosphatase: 56 U/L (ref 39–117)
BUN: 18 mg/dL (ref 6–23)
CO2: 30 mEq/L (ref 19–32)
Calcium: 8.8 mg/dL (ref 8.4–10.5)
Chloride: 99 mEq/L (ref 96–112)
Creatinine, Ser: 0.75 mg/dL (ref 0.40–1.20)
GFR: 89.4 mL/min (ref >60.00)
Glucose, Bld: 175 mg/dL — ABNORMAL HIGH (ref 70–99)
Potassium: 4 mEq/L (ref 3.5–5.1)
Sodium: 136 mEq/L (ref 135–145)
Total Bilirubin: 0.3 mg/dL (ref 0.2–1.2)
Total Protein: 6.6 g/dL (ref 6.0–8.3)

## 2021-03-27 LAB — FERRITIN: Ferritin: 19.6 ng/mL (ref 10.0–291.0)

## 2021-03-27 LAB — CBC
HCT: 38.6 % (ref 36.0–46.0)
Hemoglobin: 12.8 g/dL (ref 12.0–15.0)
MCHC: 33.1 g/dL (ref 30.0–36.0)
MCV: 89.7 fl (ref 78.0–100.0)
Platelets: 248 10*3/uL (ref 150.0–400.0)
RBC: 4.31 Mil/uL (ref 3.87–5.11)
RDW: 13.9 % (ref 11.5–15.5)
WBC: 8.2 10*3/uL (ref 4.0–10.5)

## 2021-03-27 LAB — TSH: TSH: 2.35 u[IU]/mL (ref 0.35–5.50)

## 2021-03-27 LAB — VITAMIN B12: Vitamin B-12: 256 pg/mL (ref 211–911)

## 2021-03-27 LAB — C-REACTIVE PROTEIN: CRP: <1 mg/dL (ref 0.5–20.0)

## 2021-04-24 ENCOUNTER — Ambulatory Visit: Payer: BC Managed Care – PPO | Admitting: Nurse Practitioner

## 2021-04-29 ENCOUNTER — Other Ambulatory Visit: Payer: Self-pay

## 2021-04-29 ENCOUNTER — Encounter: Payer: Self-pay | Admitting: Obstetrics and Gynecology

## 2021-04-29 ENCOUNTER — Ambulatory Visit (INDEPENDENT_AMBULATORY_CARE_PROVIDER_SITE_OTHER): Payer: BC Managed Care – PPO | Admitting: Obstetrics and Gynecology

## 2021-04-29 VITALS — BP 118/82 | HR 61 | Ht 64.5 in | Wt 143.0 lb

## 2021-04-29 DIAGNOSIS — N760 Acute vaginitis: Secondary | ICD-10-CM

## 2021-04-29 DIAGNOSIS — R300 Dysuria: Secondary | ICD-10-CM

## 2021-04-29 DIAGNOSIS — N952 Postmenopausal atrophic vaginitis: Secondary | ICD-10-CM

## 2021-04-29 LAB — URINALYSIS, COMPLETE W/RFL CULTURE
Bacteria, UA: NONE SEEN /HPF
Bilirubin Urine: NEGATIVE
Casts: NONE SEEN /LPF
Crystals: NONE SEEN /HPF
Glucose, UA: NEGATIVE
Hyaline Cast: NONE SEEN /LPF
Ketones, ur: NEGATIVE
Leukocyte Esterase: NEGATIVE
Nitrites, Initial: NEGATIVE
Protein, ur: NEGATIVE
RBC / HPF: NONE SEEN /HPF (ref 0–2)
Specific Gravity, Urine: 1.025 (ref 1.001–1.035)
WBC, UA: NONE SEEN /HPF (ref 0–5)
Yeast: NONE SEEN /HPF
pH: 5.5 (ref 5.0–8.0)

## 2021-04-29 LAB — WET PREP FOR TRICH, YEAST, CLUE

## 2021-04-29 LAB — NO CULTURE INDICATED

## 2021-04-29 MED ORDER — BETAMETHASONE VALERATE 0.1 % EX OINT: TOPICAL_OINTMENT | CUTANEOUS | 0 refills | Status: DC

## 2021-04-29 NOTE — Progress Notes (Signed)
GYNECOLOGY  VISIT   HPI: 55 y.o.   Married White or Caucasian Not Hispanic or Latino  female   [phone] with No LMP recorded. Patient has had an ablation.   here for  itching of the vulva.  She c/o a 2 week h/o vulvar itching and burning. Symptoms are mild. Burns when the urine hits the outside. No discharge, no odor. Feels generalized burning.   No change in urinary frequency or urgency. No change in the amount of urine when she voids. She isn't sure if she is burning externally  GYNECOLOGIC HISTORY: No LMP recorded. Patient has had an ablation. Contraception:  Vasectomy Menopausal hormone therapy: none         OB History     Gravida  3   Para  2   Term  2   Preterm      AB  1   Living  2      SAB  1   IAB      Ectopic      Multiple      Live Births                 Patient Active Problem List   Diagnosis Date Noted   Nonproliferative diabetic retinopathy ([place]) 02/26/2021   Tennis elbow 02/03/2011   Fibrocystic breast 83/66/2947   DM w/o complication type I ([place]) 01/26/2011    Past Medical History:  Diagnosis Date   Anemia    resolved   Diabetes mellitus    age 79   Elevated prolactin level    Pituitary adenoma ([place]) 9/11   5 mm    Past Surgical History:  Procedure Laterality Date   COLPOSCOPY     ENDOMETRIAL ABLATION  8/11   her option   LASER ABLATION OF THE CERVIX      Current Outpatient Medications  Medication Sig Dispense Refill   glucose blood (ONE TOUCH ULTRA TEST) test strip Use as instructed test 8 to 10 times a day 700 each 3   insulin lispro (HUMALOG) 100 UNIT/ML injection Sliding scale 3 mL 3   Insulin Syringe-Needle U-100 (B-D INS SYR ULTRAFINE .5CC/30G) 30G X 1/2" 0.5 ML MISC Use as directed to administer insulin 600 each 3   Multiple Vitamins-Minerals (MULTIVITAMIN ADULT PO) Take 1 tablet by mouth daily.     TRESIBA FLEXTOUCH 100 UNIT/ML SOPN FlexTouch Pen as directed.     augmented betamethasone dipropionate (DIPROLENE-AF)  0.05 % cream Apply topically 2 (two) times daily. (Patient not taking: Reported on 04/29/2021) 60 g 0   hydrOXYzine (VISTARIL) 25 MG capsule Take 1 capsule (25 mg total) by mouth every 8 (eight) hours as needed. (Patient not taking: Reported on 04/29/2021) 30 capsule 0   No current facility-administered medications for this visit.     ALLERGIES: Patient has no known allergies.  Family History  Problem Relation Age of Onset   Breast cancer Maternal Grandmother 60       and great grandmother   Hypertension Mother    Breast cancer Mother 41   Breast cancer Other    Colon cancer Neg Hx    Esophageal cancer Neg Hx    Rectal cancer Neg Hx    Stomach cancer Neg Hx     Social History   Socioeconomic History   Marital status: Married    Spouse name: Not on file   Number of children: Not on file   Years of education: Not on file   Highest education level:  Not on file  Occupational History   Not on file  Tobacco Use   Smoking status: Never   Smokeless tobacco: Never  Vaping Use   Vaping Use: Never used  Substance and Sexual Activity   Alcohol use: No   Drug use: No   Sexual activity: Yes    Birth control/protection: Other-see comments    Comment: Vasectomy  Other Topics Concern   Not on file  Social History Narrative   Not on file   Social Determinants of Health   Financial Resource Strain: Not on file  Food Insecurity: Not on file  Transportation Needs: Not on file  Physical Activity: Not on file  Stress: Not on file  Social Connections: Not on file  Intimate Partner Violence: Not on file    Review of Systems  All other systems reviewed and are negative.  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 Mild external erythema               Urethra:  normal appearing urethra with no masses, tenderness or lesions              Bartholins and Skenes: normal                 Vagina: normal appearing vagina with normal color and discharge, no lesions              Cervix: no lesions              Bimanual Exam:  Uterus:  normal size, contour, position, consistency, mobility, non-tender and anteverted              Adnexa: no mass, fullness, tenderness              Bladder: not tender  Chaperone was present for exam.  1. Vulvovaginitis - WET PREP FOR TRICH, YEAST, CLUE: negative - betamethasone valerate ointment (VALISONE) 0.1 %; Use a pea sized amount topically BID for up to 2 weeks as needed  Dispense: 30 g; Refill: 0 -Vulvar skin care information given and reviewed  2. Dysuria - Urinalysis,Complete w/RFL Culture: negative  3. Vaginal atrophy Try replens Discussed the option of vaginal estrogen, she is concerned because of her FH of breast cancer (mother, MGM and MGGM all at or after 6). Discussed that her Alexander isn't a contraindication to her using vaginal estrogen She will let me know if she wants to try vaginal estrogen

## 2021-04-29 NOTE — Patient Instructions (Signed)
Try replens vaginal moisturizer  Atrophic Vaginitis Atrophic vaginitis is a condition in which the tissues that line the vagina become dry and thin. This condition is most common in women who have stopped having regular menstrual periods (are in menopause). This usually starts when a woman is 54 to 56 years old. That is the time when a woman's estrogen levels begin to decrease. Estrogen is a female hormone. It helps to keep the tissues of the vagina moist. It stimulates the vagina to produce a clear fluid that lubricates the vagina for sex. This fluid also protects the vagina from infection. Lack of estrogen can cause the lining of the vagina to get thinner and dryer. The vagina may also shrink in size. It may become less elastic. Atrophic vaginitis tends to get worse over time as a woman's estrogen level drops. What are the causes? This condition is caused by the normal drop in estrogen that happens around the time of menopause. What increases the risk? Certain conditions or situations may lower a woman's estrogen level, leading to a higher risk for atrophic vaginitis. You are more likely to develop this condition if: You are taking medicines that block estrogen. You have had your ovaries removed. You are being treated for cancer with radiation or medicines (chemotherapy). You have given birth or are breastfeeding. You are older than age 17. You smoke. What are the signs or symptoms? Symptoms of this condition include: Pain, soreness, a feeling of pressure, or bleeding during sex (dyspareunia). Vaginal burning, irritation, or itching. Pain or bleeding when a speculum is used in a vaginal exam. Having burning pain while urinating. Vaginal discharge. In some cases, there are no symptoms. How is this diagnosed? This condition is diagnosed based on your medical history and a physical exam. This will include a pelvic exam that checks the vaginal tissues. Though rare, you may also have other  tests, including: A urine test. A test that checks the acid balance in your vagina (acid balance test). How is this treated? Treatment for this condition depends on how severe your symptoms are. Treatment may include: Using an over-the-counter vaginal lubricant before sex. Using a long-acting vaginal moisturizer. Using low-dose estrogen for moderate to severe symptoms that do not respond to other treatments. Options include creams, tablets, and inserts (vaginal rings). Before you use a vaginal estrogen, tell your health care provider if you have a history of: Breast cancer. Endometrial cancer. Blood clots. If you are not sexually active and your symptoms are very mild, you may not need treatment. Follow these instructions at home: Medicines Take over-the-counter and prescription medicines only as told by your health care provider. Do not use herbal or alternative medicines unless your health care provider says that you can. Use over-the-counter creams, lubricants, or moisturizers for dryness only as told by your health care provider. General instructions If your atrophic vaginitis is caused by menopause, discuss all of your menopause symptoms and treatment options with your health care provider. Do not douche. Do not use products that can make your vagina dry. These include: Scented feminine sprays. Scented tampons. Scented soaps. Vaginal sex can help to improve blood flow and elasticity of vaginal tissue. If you choose to have sex and it hurts, try using a water-soluble lubricant or moisturizer right before having sex. Contact a health care provider if: Your discharge looks different than normal. Your vagina has an unusual smell. You have new symptoms. Your symptoms do not improve with treatment. Your symptoms get worse. Summary Atrophic  vaginitis is a condition in which the tissues that line the vagina become dry and thin. It is most common in women who have stopped having regular  menstrual periods (are in menopause). Treatment options include using vaginal lubricants and low-dose vaginal estrogen. Contact a health care provider if your vagina has an unusual smell, or if your symptoms get worse or do not improve after treatment. This information is not intended to replace advice given to you by your health care provider. Make sure you discuss any questions you have with your health care provider. Document Revised: 01/11/2020 Document Reviewed: 01/11/2020 Elsevier Patient Education  [place].

## 2021-07-10 DIAGNOSIS — H2513 Age-related nuclear cataract, bilateral: Secondary | ICD-10-CM | POA: Diagnosis not present

## 2021-07-10 DIAGNOSIS — D3132 Benign neoplasm of left choroid: Secondary | ICD-10-CM | POA: Diagnosis not present

## 2021-07-10 DIAGNOSIS — E103293 Type 1 diabetes mellitus with mild nonproliferative diabetic retinopathy without macular edema, bilateral: Secondary | ICD-10-CM | POA: Diagnosis not present

## 2021-07-16 DIAGNOSIS — E109 Type 1 diabetes mellitus without complications: Secondary | ICD-10-CM | POA: Diagnosis not present

## 2021-07-16 DIAGNOSIS — E02 Subclinical iodine-deficiency hypothyroidism: Secondary | ICD-10-CM | POA: Diagnosis not present

## 2021-07-16 DIAGNOSIS — E221 Hyperprolactinemia: Secondary | ICD-10-CM | POA: Diagnosis not present

## 2021-07-16 DIAGNOSIS — D443 Neoplasm of uncertain behavior of pituitary gland: Secondary | ICD-10-CM | POA: Diagnosis not present

## 2021-08-11 NOTE — Progress Notes (Signed)
56 y.o. [id number] Married White or Caucasian Not Hispanic or Latino female here for annual exam. H/O endometrial ablation over 10 years ago. She states that she has always had monthly light cycles since the ablation. Sometimes she only notices it when she wipes, sometimes needs a panty liner. Never on HRT. No vasomotor symptoms. No dyspareunia.  Period Cycle (Days): 28 Period Duration (Days): 4 Period Pattern: Regular Menstrual Flow: Light Menstrual Control: Panty liner Menstrual Control Change Freq (Hours): 8 Dysmenorrhea: None History of an elevated prolactin a microadenoma. Needs yearly prolactin levels. Stable brain MRI in 5/20.  Diabetes is well controlled, recent HgbA1C was 6.   Sexually active: Yes.    The current method of family planning is vasectomy.    Exercising: Yes.     Walking and weights  Smoker:  no  Health Maintenance: Pap:  10/13/2018 WNL Hr HPV Neg 08/30/15 WNL Hr HPV Neg  History of abnormal Pap:  yes colpo 30 years ago  MMG:  11/04/20 density C Bi-rads 1 Neg  BMD:   none  Colonoscopy: 01/10/20 F/u every 5 years  TDaP:  unsure  Gardasil: none    reports that she has never smoked. She has never used smokeless tobacco. She reports that she does not drink alcohol and does not use drugs. She works very part time for [employer]. Kids are almost 24 and 27, no one lives at home.   Past Medical History:  Diagnosis Date   Anemia    resolved   Diabetes mellitus    age 89   Elevated prolactin level    Pituitary adenoma ([place]) 9/11   5 mm    Past Surgical History:  Procedure Laterality Date   COLPOSCOPY     ENDOMETRIAL ABLATION  8/11   her option   LASER ABLATION OF THE CERVIX      Current Outpatient Medications  Medication Sig Dispense Refill   Continuous Blood Gluc Sensor (DEXCOM G6 SENSOR) MISC SMARTSIG:1 Each Topical Every 10 Days     glucose blood (ONE TOUCH ULTRA TEST) test strip Use as instructed test 8 to 10 times a day 700 each 3   insulin lispro  (HUMALOG) 100 UNIT/ML injection Sliding scale 3 mL 3   Insulin Syringe-Needle U-100 (B-D INS SYR ULTRAFINE .5CC/30G) 30G X 1/2" 0.5 ML MISC Use as directed to administer insulin 600 each 3   Multiple Vitamins-Minerals (MULTIVITAMIN ADULT PO) Take 1 tablet by mouth daily.     Specialty Vitamins Products (COLLAGEN ULTRA) CAPS See admin instructions.     TRESIBA FLEXTOUCH 100 UNIT/ML SOPN FlexTouch Pen as directed.     No current facility-administered medications for this visit.    Family History  Problem Relation Age of Onset   Breast cancer Maternal Grandmother 75       and great grandmother   Hypertension Mother    Breast cancer Mother 75   Breast cancer Other    Colon cancer Neg Hx    Esophageal cancer Neg Hx    Rectal cancer Neg Hx    Stomach cancer Neg Hx     Review of Systems  All other systems reviewed and are negative.  Exam:   BP 110/64    Pulse 84    Ht 5' 4.5" (1.638 m)    Wt 146 lb 3.2 oz (66.3 kg)    LMP 07/27/2021    SpO2 99%    BMI 24.71 kg/m   Weight change: @WEIGHTCHANGE @ Height:   Height: 5' 4.5" (  163.8 cm)  Ht Readings from Last 3 Encounters:  08/13/21 5' 4.5" (1.638 m)  04/29/21 5' 4.5" (1.638 m)  03/26/21 5' 4.5" (1.638 m)    General appearance: alert, cooperative and appears stated age Head: Normocephalic, without obvious abnormality, atraumatic Neck: no adenopathy, supple, symmetrical, trachea midline and thyroid normal to inspection and palpation Lungs: clear to auscultation bilaterally Cardiovascular: regular rate and rhythm Breasts: normal appearance, no masses or tenderness Abdomen: soft, non-tender; non distended,  no masses,  no organomegaly Extremities: extremities normal, atraumatic, no cyanosis or edema Skin: Skin color, texture, turgor normal. No rashes or lesions Lymph nodes: Cervical, supraclavicular, and axillary nodes normal. 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 Grade 1-2 rectocele and cystocele with valslva (denies symptoms)              Cervix: no lesions and appears friable               Bimanual Exam:  Uterus:  normal size, contour, position, consistency, mobility, non-tender              Adnexa: no mass, fullness, tenderness               Rectovaginal: Confirms               Anus:  normal sphincter tone, no lesions  Gae Dry chaperoned for the exam.   1. Well woman exam Discussed breast self exam Discussed calcium and vit D intake Mammogram in 4/23 Colonoscopy UTD  2. Vaginal bleeding States she has never stopped getting monthly cycles, very light -Calendar her cycles. She will call if they are anything but clockwork regular.  - Follicle stimulating hormone  3. Pituitary microadenoma ([facility]) Stable MRI in 5/20 - Prolactin

## 2021-08-13 ENCOUNTER — Ambulatory Visit (INDEPENDENT_AMBULATORY_CARE_PROVIDER_SITE_OTHER): Payer: BC Managed Care – PPO | Admitting: Obstetrics and Gynecology

## 2021-08-13 ENCOUNTER — Encounter: Payer: Self-pay | Admitting: Obstetrics and Gynecology

## 2021-08-13 ENCOUNTER — Other Ambulatory Visit: Payer: Self-pay

## 2021-08-13 VITALS — BP 110/64 | HR 84 | Ht 64.5 in | Wt 146.2 lb

## 2021-08-13 DIAGNOSIS — D352 Benign neoplasm of pituitary gland: Secondary | ICD-10-CM

## 2021-08-13 DIAGNOSIS — N939 Abnormal uterine and vaginal bleeding, unspecified: Secondary | ICD-10-CM | POA: Diagnosis not present

## 2021-08-13 DIAGNOSIS — Z01419 Encounter for gynecological examination (general) (routine) without abnormal findings: Secondary | ICD-10-CM | POA: Diagnosis not present

## 2021-08-13 LAB — FOLLICLE STIMULATING HORMONE: FSH: 6.6 m[IU]/mL

## 2021-08-13 LAB — PROLACTIN: Prolactin: 28.4 ng/mL

## 2021-08-13 NOTE — Patient Instructions (Signed)

## 2021-11-05 DIAGNOSIS — B9689 Other specified bacterial agents as the cause of diseases classified elsewhere: Secondary | ICD-10-CM | POA: Diagnosis not present

## 2021-11-05 DIAGNOSIS — J0100 Acute maxillary sinusitis, unspecified: Secondary | ICD-10-CM | POA: Diagnosis not present

## 2021-12-04 ENCOUNTER — Other Ambulatory Visit: Payer: Self-pay | Admitting: Family Medicine

## 2021-12-04 DIAGNOSIS — Z1231 Encounter for screening mammogram for malignant neoplasm of breast: Secondary | ICD-10-CM

## 2021-12-24 ENCOUNTER — Ambulatory Visit
Admission: RE | Admit: 2021-12-24 | Discharge: 2021-12-24 | Disposition: A | Discharge status: Home / Self Care | Payer: 59 | Source: Ambulatory Visit | Attending: Family Medicine | Admitting: Family Medicine

## 2021-12-24 DIAGNOSIS — Z1231 Encounter for screening mammogram for malignant neoplasm of breast: Secondary | ICD-10-CM

## 2022-01-07 DIAGNOSIS — E02 Subclinical iodine-deficiency hypothyroidism: Secondary | ICD-10-CM | POA: Diagnosis not present

## 2022-01-07 DIAGNOSIS — E221 Hyperprolactinemia: Secondary | ICD-10-CM | POA: Diagnosis not present

## 2022-01-07 DIAGNOSIS — E109 Type 1 diabetes mellitus without complications: Secondary | ICD-10-CM | POA: Diagnosis not present

## 2022-01-09 ENCOUNTER — Ambulatory Visit: Payer: 59 | Admitting: Family

## 2022-01-09 ENCOUNTER — Encounter: Payer: Self-pay | Admitting: Family

## 2022-01-09 VITALS — BP 104/70 | HR 60 | Resp 20 | Ht 64.5 in | Wt 148.0 lb

## 2022-01-09 DIAGNOSIS — M542 Cervicalgia: Secondary | ICD-10-CM | POA: Diagnosis not present

## 2022-01-09 MED ORDER — METHOCARBAMOL 500 MG PO TABS
500.0000 mg | ORAL_TABLET | Freq: Four times a day (QID) | ORAL | 0 refills | Status: DC | PRN
Start: 2022-01-09 — End: 2022-11-12

## 2022-01-09 MED ORDER — MELOXICAM 15 MG PO TABS: 15.0000 mg | ORAL_TABLET | Freq: Every day | ORAL | 0 refills | Status: DC
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
## 2022-01-14 ENCOUNTER — Other Ambulatory Visit: Payer: Self-pay | Admitting: Endocrinology

## 2022-01-14 DIAGNOSIS — E7800 Pure hypercholesterolemia, unspecified: Secondary | ICD-10-CM

## 2022-01-14 DIAGNOSIS — E109 Type 1 diabetes mellitus without complications: Secondary | ICD-10-CM
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
## 2022-01-15 ENCOUNTER — Ambulatory Visit (INDEPENDENT_AMBULATORY_CARE_PROVIDER_SITE_OTHER): Payer: 59

## 2022-01-15 ENCOUNTER — Ambulatory Visit: Payer: 59 | Admitting: Sports Medicine

## 2022-01-15 VITALS — BP 110/80 | HR 77 | Ht 64.0 in | Wt 148.0 lb

## 2022-01-15 DIAGNOSIS — S134XXA Sprain of ligaments of cervical spine, initial encounter: Secondary | ICD-10-CM

## 2022-01-15 DIAGNOSIS — M542 Cervicalgia: Secondary | ICD-10-CM

## 2022-01-15 MED ORDER — CYCLOBENZAPRINE HCL 5 MG PO TABS: 5.0000 mg | ORAL_TABLET | Freq: Three times a day (TID) | ORAL | 0 refills | Status: DC | PRN

## 2022-01-15 NOTE — Patient Instructions (Addendum)
Good to see you Neck HEP  Flexeril 5 mg nightly can increase to 10 mg night if needed As needed follow up

## 2022-02-27 ENCOUNTER — Telehealth: Payer: Self-pay

## 2022-02-27 MED ORDER — CYCLOBENZAPRINE HCL 5 MG PO TABS: 5.0000 mg | ORAL_TABLET | Freq: Three times a day (TID) | ORAL | 0 refills | Status: DC | PRN

## 2022-02-27 NOTE — Telephone Encounter (Signed)
Patient called stating that she needed a refill on her medication that she had one pill left and she is afraid to go without it because when she forgot to bring the medication with her on vacation the pain was really bad. Informed patient that Dr. Glennon Mac is out of the office today and no other providers where in the office I sent in 20 tablets to get patient by until Dr. Glennon Mac gets back and if she starts running low again and still feels she needs him to make a follow up but not to wait till she gets to her last pill.  Patient stated understanding.
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
## 2022-06-01 ENCOUNTER — Ambulatory Visit (INDEPENDENT_AMBULATORY_CARE_PROVIDER_SITE_OTHER): Payer: 59 | Admitting: Sports Medicine

## 2022-06-01 VITALS — BP 116/80 | HR 68 | Ht 64.0 in | Wt 148.0 lb

## 2022-06-01 DIAGNOSIS — M542 Cervicalgia: Secondary | ICD-10-CM

## 2022-06-01 DIAGNOSIS — S161XXA Strain of muscle, fascia and tendon at neck level, initial encounter: Secondary | ICD-10-CM | POA: Diagnosis not present

## 2022-06-01 MED ORDER — CYCLOBENZAPRINE HCL 5 MG PO TABS: 5.0000 mg | ORAL_TABLET | Freq: Three times a day (TID) | ORAL | 0 refills | Status: DC | PRN

## 2022-06-01 MED ORDER — MELOXICAM 15 MG PO TABS: 15.0000 mg | ORAL_TABLET | Freq: Every day | ORAL | 0 refills | Status: DC

## 2022-06-01 NOTE — Patient Instructions (Addendum)
Good to see you  - Start meloxicam 15 mg daily x2 weeks.  If still having pain after 2 weeks, complete 3rd-week of meloxicam. May use remaining meloxicam as needed once daily for pain control.  Do not to use additional NSAIDs while taking meloxicam.  May use Tylenol [phone] mg 2 to 3 times a day for breakthrough pain. Flexeril 5 mg 3 times a day as needed Neck and trap HEP  Pt referral  3-4 week follow up

## 2022-06-04 ENCOUNTER — Ambulatory Visit: Payer: 59 | Attending: Sports Medicine | Admitting: Physical Therapy

## 2022-06-04 ENCOUNTER — Encounter: Payer: Self-pay | Admitting: Physical Therapy

## 2022-06-04 DIAGNOSIS — S161XXA Strain of muscle, fascia and tendon at neck level, initial encounter: Secondary | ICD-10-CM | POA: Insufficient documentation

## 2022-06-04 DIAGNOSIS — M542 Cervicalgia: Secondary | ICD-10-CM | POA: Diagnosis not present

## 2022-06-04 DIAGNOSIS — R293 Abnormal posture: Secondary | ICD-10-CM | POA: Diagnosis present

## 2022-06-04 DIAGNOSIS — R252 Cramp and spasm: Secondary | ICD-10-CM | POA: Insufficient documentation
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
## 2022-06-04 NOTE — Patient Instructions (Signed)

## 2022-06-10 ENCOUNTER — Ambulatory Visit: Payer: 59

## 2022-06-10 DIAGNOSIS — R252 Cramp and spasm: Secondary | ICD-10-CM

## 2022-06-10 DIAGNOSIS — M542 Cervicalgia: Secondary | ICD-10-CM

## 2022-06-10 DIAGNOSIS — R293 Abnormal posture: Secondary | ICD-10-CM

## 2022-06-10 NOTE — Therapy (Signed)
OUTPATIENT PHYSICAL THERAPY TREATMENT   Patient Name: Maria White MRN: [id number] DOB:04/25/1966, 56 y.o., female Today's Date: 06/10/2022   PT End of Session - 06/10/22 1537     Visit Number 2    Number of Visits 12    Date for PT Re-Evaluation 07/16/22    PT Start Time 3382    PT Stop Time 1531    PT Time Calculation (min) 44 min    Activity Tolerance Patient tolerated treatment well    Behavior During Therapy [facility] for tasks assessed/performed              Past Medical History:  Diagnosis Date   Anemia    resolved   Diabetes mellitus    age 36   Elevated prolactin level    Pituitary adenoma ([place]) 9/11   5 mm   Past Surgical History:  Procedure Laterality Date   COLPOSCOPY     ENDOMETRIAL ABLATION  8/11   her option   LASER ABLATION OF THE CERVIX     Patient Active Problem List   Diagnosis Date Noted   Nonproliferative diabetic retinopathy ([place]) 02/26/2021   Tennis elbow 02/03/2011   Fibrocystic breast 50/53/9767   DM w/o complication type I ([place]) 01/26/2011    PCP: Lamar Blinks, MD  REFERRING PROVIDER: Glennon Mac, DO  REFERRING DIAG: M54.2 (ICD-10-CM) - Neck pain S16.1XXA (ICD-10-CM) - Strain of cervical portion of both trapezius muscles  THERAPY DIAG:  Cervicalgia  Cramp and spasm  Abnormal posture  Rationale for Evaluation and Treatment: Rehabilitation  ONSET DATE: Dec 23, 2021  SUBJECTIVE:                                                                                                                                                                                                         SUBJECTIVE STATEMENT: Pt reports that her pain seems better but she is still having difficulty with turning her head to L and extension.   PERTINENT HISTORY:  T1DM  PAIN:  Are you having pain? Yes: NPRS scale: 4-5/10 Pain location: neck, L worse than R Pain description: sharp shooting Aggravating factors: turning neck, looking up,  moving quickly Relieving factors: sitting still   PRECAUTIONS: None  WEIGHT BEARING RESTRICTIONS: No  FALLS:  Has patient fallen in last 6 months? No  LIVING ENVIRONMENT: Lives with: lives with their spouse Lives in: House/apartment Stairs: Yes: Internal: 15 steps; on right going up and External: 3 steps; on right going up Has following equipment at home: None  OCCUPATION: works for [employer]  PLOF:  Independent and Leisure: walk, lift weights at gym  PATIENT GOALS: to be normal again, to be able to turn my head without turning whole body  NEXT MD VISIT:  06/22/2022  OBJECTIVE:   DIAGNOSTIC FINDINGS:  Xray cervical spine 01/15/22 - FINDINGS: There is no evidence of cervical spine fracture or prevertebral soft tissue swelling. Alignment is normal. Minimal anterior osteophytosis is identified at C4 and C5.   IMPRESSION: No acute fracture or dislocation.   PATIENT SURVEYS:  NDI 7/50 = 14% impairment.   COGNITION: Overall cognitive status: Within functional limits for tasks assessed  SENSATION: WFL  POSTURE: rounded shoulders and forward head  PALPATION: Tenderness in suboccipitals and cervical paraspinals bil, tightness throughout bil UT and L/S.    CERVICAL ROM:   Active ROM AROM (deg) eval  Flexion 18 *  Extension 20*   Right lateral flexion 12 *  Left lateral flexion 14 *   Right rotation 35 *  Left rotation 35 *   (Blank rows = not tested) *very slow guarded movementw with pain at end range.   UPPER EXTREMITY ROM:  WNL, symmetric and painfree bil, noted slight tightness bil with flexion.    UPPER EXTREMITY MMT: 5/5 bil, slightly decreased grip strength bil.    CERVICAL SPECIAL TESTS:  Spurling's test: Negative and Sharp pursor's test: Negative  FUNCTIONAL TESTS:  NA  TODAY'S TREATMENT:                                                                                                                              DATE:  06/10/22 Therapeutic  Exercise: to improve strength and mobility.  Demo, verbal and tactile cues throughout for technique. UBE L1.0 3 min fwd/ 16mn back Review of HEP exercises Standing row 2x10 RTB Standing shoulder ext RTB 2x10 Lat pulls 20# 2x10 Seated row 25# x 20 reps  Manual Therapy: STM to L UT and cervical paraspinals  06/04/2022 Therapeutic Exercise: to improve strength and mobility.  Demo, verbal and tactile cues throughout for technique.  - Seated Cervical Retraction 5 reps - 3 sec  hold - Seated Scapular Retraction  5 reps - 5 sec  hold - Seated Shoulder Shrug Circles AROM Backward  - 5 reps - Supine Chin Tucks on Pillow -  10 reps - 3 sec  hold - Cervical Extension AROM with Pillowcase  10 reps  PATIENT EDUCATION:  Education details: findings, POC, education on dry needling, initial HEP Person educated: Patient Education method: Explanation, Demonstration, Verbal cues, and Handouts Education comprehension: verbalized understanding and returned demonstration  HOME EXERCISE PROGRAM: Access Code: [email or web address] URL: [email or web address] Date: 06/10/2022 Prepared by: BClarene Essex Exercises - Seated Cervical Retraction  - 3 x daily - 7 x weekly - 1 sets - 10 reps - 3 sec  hold - Seated Shoulder Shrug Circles AROM Backward  - 3 x daily - 7 x weekly - 1 sets - 10 reps - Supine Chin Tucks on Flat  Ball  - 1 x daily - 7 x weekly - 1 sets - 10 reps - 3 sec  hold - Cervical Extension AROM with Strap  - 1 x daily - 7 x weekly - 1 sets - 10 reps - Standing Shoulder Row with Anchored Resistance  - 1 x daily - 7 x weekly - 2 sets - 10 reps - Shoulder extension with resistance - Neutral  - 1 x daily - 7 x weekly - 2 sets - 10 reps  ASSESSMENT:  CLINICAL IMPRESSION: Pt had reports of improved pain since evaluation. We were able to progress postural strengthening exercises, while also requiring cues to facilitate proper scapular retraction and depression. Provided education on gym machines for  posture and safety. Pt had very taut band along L UT area with TTP but after manual she was able to turn head with less pain.  OBJECTIVE IMPAIRMENTS: decreased mobility, decreased ROM, hypomobility, increased fascial restrictions, increased muscle spasms, improper body mechanics, postural dysfunction, and pain.   ACTIVITY LIMITATIONS: carrying, lifting, and bending  PARTICIPATION LIMITATIONS: driving, shopping, community activity, and occupation  PERSONAL FACTORS: Time since onset of injury/illness/exacerbation and 1 comorbidity: T1DM  are also affecting patient's functional outcome.   REHAB POTENTIAL: Excellent  CLINICAL DECISION MAKING: Stable/uncomplicated  EVALUATION COMPLEXITY: Low   GOALS: Goals reviewed with patient? Yes  SHORT TERM GOALS: Target date: 06/18/2022   Patient will be independent with initial HEP.  Baseline: given Goal status: IN PROGRESS   LONG TERM GOALS: Target date: 07/16/2022   Patient will be independent with advanced/ongoing HEP to improve outcomes and carryover.  Baseline: needs progression Goal status: IN PROGRESS  2.  Patient will report 75% improvement in neck pain to improve QOL.  Baseline:  Goal status: IN PROGRESS  3.  Patient will demonstrate full pain free cervical ROM for safety with driving.  Baseline: see objective, significant limitations Goal status: IN PROGRESS  4.  Patient will report <4% impairment on NDI  to demonstrate improved functional ability.  Baseline: 14% Goal status: IN PROGRESS  5.  Patient will demonstrate improved posture to decrease muscle imbalance. Baseline: slight forward head, decreased cervical lordosis, slightly rounded shoulders.  Goal status: IN PROGRESS  PLAN:  PT FREQUENCY: 1-2x/week  PT DURATION: 6 weeks  PLANNED INTERVENTIONS: Therapeutic exercises, Therapeutic activity, Neuromuscular re-education, Balance training, Gait training, Patient/Family education, Self Care, Joint mobilization, Dry  Needling, Electrical stimulation, Spinal mobilization, Cryotherapy, Moist heat, Taping, Traction, Ultrasound, Manual therapy, and Re-evaluation  PLAN FOR NEXT SESSION: review and progress HEP for postural strengthening, 1st rib mobs, manual therapy and DN to cervical muscles, modalities PRN   Artist Pais, PTA 06/10/2022, 4:24 PM

## 2022-06-22 ENCOUNTER — Ambulatory Visit: Payer: 59 | Admitting: Sports Medicine

## 2022-06-24 ENCOUNTER — Ambulatory Visit: Payer: 59 | Admitting: Physical Therapy

## 2022-06-30 ENCOUNTER — Ambulatory Visit: Payer: 59 | Attending: Sports Medicine | Admitting: Physical Therapy

## 2022-06-30 ENCOUNTER — Encounter: Payer: Self-pay | Admitting: Physical Therapy

## 2022-06-30 DIAGNOSIS — R293 Abnormal posture: Secondary | ICD-10-CM | POA: Diagnosis present

## 2022-06-30 DIAGNOSIS — R252 Cramp and spasm: Secondary | ICD-10-CM | POA: Insufficient documentation

## 2022-06-30 DIAGNOSIS — M542 Cervicalgia: Secondary | ICD-10-CM | POA: Insufficient documentation

## 2022-06-30 NOTE — Therapy (Signed)
OUTPATIENT PHYSICAL THERAPY TREATMENT   Patient Name: Maria White MRN: [id number] DOB:09/27/1965, 56 y.o., female Today's Date: 06/30/2022   PT End of Session - 06/30/22 0840     Visit Number 3    Number of Visits 12    Date for PT Re-Evaluation 07/16/22    PT Start Time 0840    PT Stop Time 0930    PT Time Calculation (min) 50 min    Activity Tolerance Patient tolerated treatment well    Behavior During Therapy [facility] for tasks assessed/performed              Past Medical History:  Diagnosis Date   Anemia    resolved   Diabetes mellitus    age 39   Elevated prolactin level    Pituitary adenoma ([place]) 9/11   5 mm   Past Surgical History:  Procedure Laterality Date   COLPOSCOPY     ENDOMETRIAL ABLATION  8/11   her option   LASER ABLATION OF THE CERVIX     Patient Active Problem List   Diagnosis Date Noted   Nonproliferative diabetic retinopathy ([place]) 02/26/2021   Tennis elbow 02/03/2011   Fibrocystic breast 02/58/5277   DM w/o complication type I ([place]) 01/26/2011    PCP: Lamar Blinks, MD  REFERRING PROVIDER: Glennon Mac, DO  REFERRING DIAG: M54.2 (ICD-10-CM) - Neck pain S16.1XXA (ICD-10-CM) - Strain of cervical portion of both trapezius muscles  THERAPY DIAG:  Cervicalgia  Cramp and spasm  Abnormal posture  Rationale for Evaluation and Treatment: Rehabilitation  ONSET DATE: Dec 23, 2021  SUBJECTIVE:                                                                                                                                                                                                         SUBJECTIVE STATEMENT: "Still have some pain but I can move it better, some of its when I sleep, when I wake up middle of night its stiff, and sore in the morning."  PERTINENT HISTORY:  T1DM  PAIN:  Are you having pain? Yes: NPRS scale: 2-3/10 Pain location: neck, L worse than R Pain description: twinge Aggravating factors: turning neck,  looking up, moving quickly Relieving factors: sitting still   PRECAUTIONS: None  WEIGHT BEARING RESTRICTIONS: No  FALLS:  Has patient fallen in last 6 months? No  LIVING ENVIRONMENT: Lives with: lives with their spouse Lives in: House/apartment Stairs: Yes: Internal: 15 steps; on right going up and External: 3 steps; on right going up Has following equipment at home: None  OCCUPATION: works for [employer]  PLOF: Independent and Leisure: walk, lift [employer] at gym  PATIENT GOALS: to be normal again, to be able to turn my head without turning whole body  NEXT MD VISIT:  06/22/2022  OBJECTIVE:   DIAGNOSTIC FINDINGS:  Xray cervical spine 01/15/22 - FINDINGS: There is no evidence of cervical spine fracture or prevertebral soft tissue swelling. Alignment is normal. Minimal anterior osteophytosis is identified at C4 and C5.   IMPRESSION: No acute fracture or dislocation.   PATIENT SURVEYS:  NDI 7/50 = 14% impairment.   COGNITION: Overall cognitive status: Within functional limits for tasks assessed  SENSATION: WFL  POSTURE: rounded shoulders and forward head  PALPATION: Tenderness in suboccipitals and cervical paraspinals bil, tightness throughout bil UT and L/S.    CERVICAL ROM:   Active ROM AROM (deg) eval  Flexion 18 *  Extension 20*   Right lateral flexion 12 *  Left lateral flexion 14 *   Right rotation 35 *  Left rotation 35 *   (Blank rows = not tested) *very slow guarded movementw with pain at end range.   UPPER EXTREMITY ROM:  WNL, symmetric and painfree bil, noted slight tightness bil with flexion.    UPPER EXTREMITY MMT: 5/5 bil, slightly decreased grip strength bil.    CERVICAL SPECIAL TESTS:  Spurling's test: Negative and Sharp pursor's test: Negative  FUNCTIONAL TESTS:  NA  TODAY'S TREATMENT:                                                                                                                              DATE:  06/30/2022  Therapeutic Exercise: to improve strength and mobility.  Demo, verbal and tactile cues throughout for technique. UBE L1 x 6 min (45f3b) Cervical extension with pillow case x 10 Cervical rotation with pillow case x 10 S/L open books x 10 bil  Thoracic extensions on foam roller (crosswise) x 10 Thoracic mobilization on foam roller with arm flexion x 10 bil Cat cows x 10 Child pose x 3 Bird dogs x 10 Rows 20# 2 x 10 Lat pulls 20# x 10 seated, x 10 standing Chest press 10# x 10 Manual Therapy: to decrease muscle spasm and pain and improve mobility STM/TPR to cervical paraspinals, NAGS into rotation, gentle cervical traction, UPA/PA mobs cervical spine, MFR to bil UT.   06/10/22 Therapeutic Exercise: to improve strength and mobility.  Demo, verbal and tactile cues throughout for technique. UBE L1.0 3 min fwd/ 384m back Review of HEP exercises Standing row 2x10 RTB Standing shoulder ext RTB 2x10 Lat pulls 20# 2x10 Seated row 25# x 20 reps  Manual Therapy: STM to L UT and cervical paraspinals  06/04/2022 Therapeutic Exercise: to improve strength and mobility.  Demo, verbal and tactile cues throughout for technique.  - Seated Cervical Retraction 5 reps - 3 sec  hold - Seated Scapular Retraction  5 reps - 5 sec  hold - Seated Shoulder Shrug  Circles AROM Backward  - 5 reps - Supine Chin Tucks on Pillow -  10 reps - 3 sec  hold - Cervical Extension AROM with Pillowcase  10 reps  PATIENT EDUCATION:  Education details: HEP update  06/30/22 Person educated: Patient Education method: Explanation, Demonstration, Verbal cues, and Handouts Education comprehension: verbalized understanding and returned demonstration  HOME EXERCISE PROGRAM: Access Code: [email or web address] URL: [email or web address] Date: 06/30/2022 Prepared by: Glenetta Hew  Exercises - Seated Cervical Retraction  - 3 x daily - 7 x weekly - 1 sets - 10 reps - 3 sec  hold - Seated Shoulder Shrug Circles AROM  Backward  - 3 x daily - 7 x weekly - 1 sets - 10 reps - Supine Chin Tucks on Flat Ball  - 1 x daily - 7 x weekly - 1 sets - 10 reps - 3 sec  hold - Cervical Extension AROM with Strap  - 1 x daily - 7 x weekly - 1 sets - 10 reps - Standing Shoulder Row with Anchored Resistance  - 1 x daily - 7 x weekly - 2 sets - 10 reps - Shoulder extension with resistance - Neutral  - 1 x daily - 7 x weekly - 2 sets - 10 reps - Sidelying Open Book Thoracic Lumbar Rotation and Extension  - 1 x daily - 7 x weekly - 1- sets - 10 reps - Seated Assisted Cervical Rotation with Towel  - 1 x daily - 7 x weekly - 1 sets - 10 reps  ASSESSMENT:  CLINICAL IMPRESSION: ERNESTO LASHWAY reports continued progress with pain, but still having a lot of tightness and soreness in morning.  She is demonstrating improving ROM with less guarding.  Today progress exercises focusing on thoracic mobility, noted decreased mobility with cat/cows and child pose throughout spine.  Difficulty with seated lat pulls, improved with standing.  Updated HEP for open book stretch to improve spinal mobility.  Reported decreased pain following manual therapy and declined modalities.  NIMAH UPHOFF continues to demonstrate potential for improvement and would benefit from continued skilled therapy to address impairments.     OBJECTIVE IMPAIRMENTS: decreased mobility, decreased ROM, hypomobility, increased fascial restrictions, increased muscle spasms, improper body mechanics, postural dysfunction, and pain.   ACTIVITY LIMITATIONS: carrying, lifting, and bending  PARTICIPATION LIMITATIONS: driving, shopping, community activity, and occupation  PERSONAL FACTORS: Time since onset of injury/illness/exacerbation and 1 comorbidity: T1DM  are also affecting patient's functional outcome.   REHAB POTENTIAL: Excellent  CLINICAL DECISION MAKING: Stable/uncomplicated  EVALUATION COMPLEXITY: Low   GOALS: Goals reviewed with patient? Yes  SHORT TERM  GOALS: Target date: 06/18/2022   Patient will be independent with initial HEP.  Baseline: given Goal status: IN PROGRESS   LONG TERM GOALS: Target date: 07/16/2022   Patient will be independent with advanced/ongoing HEP to improve outcomes and carryover.  Baseline: needs progression Goal status: IN PROGRESS  2.  Patient will report 75% improvement in neck pain to improve QOL.  Baseline:  Goal status: IN PROGRESS  3.  Patient will demonstrate full pain free cervical ROM for safety with driving.  Baseline: see objective, significant limitations Goal status: IN PROGRESS  4.  Patient will report <4% impairment on NDI  to demonstrate improved functional ability.  Baseline: 14% Goal status: IN PROGRESS  5.  Patient will demonstrate improved posture to decrease muscle imbalance. Baseline: slight forward head, decreased cervical lordosis, slightly rounded shoulders.  Goal status: IN PROGRESS  PLAN:  PT FREQUENCY: 1-2x/week  PT DURATION: 6 weeks  PLANNED INTERVENTIONS: Therapeutic exercises, Therapeutic activity, Neuromuscular re-education, Balance training, Gait training, Patient/Family education, Self Care, Joint mobilization, Dry Needling, Electrical stimulation, Spinal mobilization, Cryotherapy, Moist heat, Taping, Traction, Ultrasound, Manual therapy, and Re-evaluation  PLAN FOR NEXT SESSION: continue postural strengthening and mobility, manual therapy,  modalities PRN.  Not interested in DN at this time.    Rennie Natter, PT, DPT  06/30/2022, 9:44 AM

## 2022-07-02 ENCOUNTER — Ambulatory Visit: Payer: 59

## 2022-07-02 DIAGNOSIS — R252 Cramp and spasm: Secondary | ICD-10-CM

## 2022-07-02 DIAGNOSIS — M542 Cervicalgia: Secondary | ICD-10-CM

## 2022-07-02 DIAGNOSIS — R293 Abnormal posture: Secondary | ICD-10-CM

## 2022-07-02 NOTE — Therapy (Signed)
OUTPATIENT PHYSICAL THERAPY TREATMENT   Patient Name: Maria White MRN: [id number] DOB:1965/12/01, 56 y.o., female Today's Date: 07/02/2022   PT End of Session - 07/02/22 1001     Visit Number 4    Number of Visits 12    Date for PT Re-Evaluation 07/16/22    PT Start Time 0933    PT Stop Time 9528    PT Time Calculation (min) 41 min    Activity Tolerance Patient tolerated treatment well    Behavior During Therapy [facility] for tasks assessed/performed               Past Medical History:  Diagnosis Date   Anemia    resolved   Diabetes mellitus    age 70   Elevated prolactin level    Pituitary adenoma ([place]) 9/11   5 mm   Past Surgical History:  Procedure Laterality Date   COLPOSCOPY     ENDOMETRIAL ABLATION  8/11   her option   LASER ABLATION OF THE CERVIX     Patient Active Problem List   Diagnosis Date Noted   Nonproliferative diabetic retinopathy ([place]) 02/26/2021   Tennis elbow 02/03/2011   Fibrocystic breast 41/32/4401   DM w/o complication type I ([place]) 01/26/2011    PCP: Lamar Blinks, MD  REFERRING PROVIDER: Glennon Mac, DO  REFERRING DIAG: M54.2 (ICD-10-CM) - Neck pain S16.1XXA (ICD-10-CM) - Strain of cervical portion of both trapezius muscles  THERAPY DIAG:  Cervicalgia  Cramp and spasm  Abnormal posture  Rationale for Evaluation and Treatment: Rehabilitation  ONSET DATE: Dec 23, 2021  SUBJECTIVE:                                                                                                                                                                                                         SUBJECTIVE STATEMENT: Only having pain when turning head either direction.  PERTINENT HISTORY:  T1DM  PAIN:  Are you having pain? Yes: NPRS scale: 2-3/10 Pain location: neck, L worse than R Pain description: twinge Aggravating factors: turning neck, looking up, moving quickly Relieving factors: sitting still   PRECAUTIONS:  None  WEIGHT BEARING RESTRICTIONS: No  FALLS:  Has patient fallen in last 6 months? No  LIVING ENVIRONMENT: Lives with: lives with their spouse Lives in: House/apartment Stairs: Yes: Internal: 15 steps; on right going up and External: 3 steps; on right going up Has following equipment at home: None  OCCUPATION: works for [employer]  PLOF: Independent and Leisure: walk, lift weights at gym  PATIENT GOALS: to be normal  again, to be able to turn my head without turning whole body  NEXT MD VISIT:  06/22/2022  OBJECTIVE:   DIAGNOSTIC FINDINGS:  Xray cervical spine 01/15/22 - FINDINGS: There is no evidence of cervical spine fracture or prevertebral soft tissue swelling. Alignment is normal. Minimal anterior osteophytosis is identified at C4 and C5.   IMPRESSION: No acute fracture or dislocation.   PATIENT SURVEYS:  NDI 7/50 = 14% impairment.   COGNITION: Overall cognitive status: Within functional limits for tasks assessed  SENSATION: WFL  POSTURE: rounded shoulders and forward head  PALPATION: Tenderness in suboccipitals and cervical paraspinals bil, tightness throughout bil UT and L/S.    CERVICAL ROM:   Active ROM AROM (deg) eval  Flexion 18 *  Extension 20*   Right lateral flexion 12 *  Left lateral flexion 14 *   Right rotation 35 *  Left rotation 35 *   (Blank rows = not tested) *very slow guarded movementw with pain at end range.   UPPER EXTREMITY ROM:  WNL, symmetric and painfree bil, noted slight tightness bil with flexion.    UPPER EXTREMITY MMT: 5/5 bil, slightly decreased grip strength bil.    CERVICAL SPECIAL TESTS:  Spurling's test: Negative and Sharp pursor's test: Negative  FUNCTIONAL TESTS:  NA  TODAY'S TREATMENT:                                                                                                                              DATE:  07/02/22 Therapeutic Exercise: to improve strength and mobility.  Demo, verbal and  tactile cues throughout for technique. UBE L1 x 6 min (79f3b) Suboccipital stretch 3x15" Cervical extension with pillow case x 10 Cervical rotation with pillow case x 10 Seated row 15lb high grip 2x10 Standing horizontal RTB 2x10  Standing mid row RTB x 20 Standing ER RTB x 10 against doorframe  Manual Therapy: STM to R suboccipitals, L UT  06/30/2022 Therapeutic Exercise: to improve strength and mobility.  Demo, verbal and tactile cues throughout for technique. UBE L1 x 6 min (374fb) Cervical extension with pillow case x 10 Cervical rotation with pillow case x 10 S/L open books x 10 bil  Thoracic extensions on foam roller (crosswise) x 10 Thoracic mobilization on foam roller with arm flexion x 10 bil Cat cows x 10 Child pose x 3 Bird dogs x 10 Rows 20# 2 x 10 Lat pulls 20# x 10 seated, x 10 standing Chest press 10# x 10 Manual Therapy: to decrease muscle spasm and pain and improve mobility STM/TPR to cervical paraspinals, NAGS into rotation, gentle cervical traction, UPA/PA mobs cervical spine, MFR to bil UT.   06/10/22 Therapeutic Exercise: to improve strength and mobility.  Demo, verbal and tactile cues throughout for technique. UBE L1.0 3 min fwd/ [id number] Review of HEP exercises Standing row 2x10 RTB Standing shoulder ext RTB 2x10 Lat pulls 20# 2x10 Seated row 25# x 20 reps  Manual Therapy:  STM to L UT and cervical paraspinals  06/04/2022 Therapeutic Exercise: to improve strength and mobility.  Demo, verbal and tactile cues throughout for technique.  - Seated Cervical Retraction 5 reps - 3 sec  hold - Seated Scapular Retraction  5 reps - 5 sec  hold - Seated Shoulder Shrug Circles AROM Backward  - 5 reps - Supine Chin Tucks on Pillow -  10 reps - 3 sec  hold - Cervical Extension AROM with Pillowcase  10 reps  PATIENT EDUCATION:  Education details: HEP update  06/30/22 Person educated: Patient Education method: Explanation, Demonstration, Verbal cues, and  Handouts Education comprehension: verbalized understanding and returned demonstration  HOME EXERCISE PROGRAM: Access Code: [email or web address] URL: [email or web address] Date: 07/02/2022 Prepared by: Clarene Essex  Exercises - Seated Cervical Retraction  - 3 x daily - 7 x weekly - 1 sets - 10 reps - 3 sec  hold - Seated Shoulder Shrug Circles AROM Backward  - 3 x daily - 7 x weekly - 1 sets - 10 reps - Supine Chin Tucks on Flat Ball  - 1 x daily - 7 x weekly - 1 sets - 10 reps - 3 sec  hold - Cervical Extension AROM with Strap  - 1 x daily - 7 x weekly - 1 sets - 10 reps - Standing Shoulder Row with Anchored Resistance  - 1 x daily - 7 x weekly - 2 sets - 10 reps - Shoulder extension with resistance - Neutral  - 1 x daily - 7 x weekly - 2 sets - 10 reps - Sidelying Open Book Thoracic Lumbar Rotation and Extension  - 1 x daily - 7 x weekly - 1- sets - 10 reps - Seated Assisted Cervical Rotation with Towel  - 1 x daily - 7 x weekly - 1 sets - 10 reps - Shoulder External Rotation and Scapular Retraction with Resistance  - 1 x daily - 7 x weekly - 2 sets - 10 reps - Standing Shoulder Horizontal Abduction with Resistance  - 1 x daily - 7 x weekly - 2 sets - 10 reps  ASSESSMENT:  CLINICAL IMPRESSION: Pt reports much improvement in cervical pain but now only noted pain with rotation mostly to the R. MT was done to reduce muscle restriction preventing  R cervical rotation, after manual she was able to rotate head with less pain. Continued progression of postural strengthening with no issues or complaints. Updated HEP with ER and horiz ABD to progress postural strengthening    OBJECTIVE IMPAIRMENTS: decreased mobility, decreased ROM, hypomobility, increased fascial restrictions, increased muscle spasms, improper body mechanics, postural dysfunction, and pain.   ACTIVITY LIMITATIONS: carrying, lifting, and bending  PARTICIPATION LIMITATIONS: driving, shopping, community activity, and  occupation  PERSONAL FACTORS: Time since onset of injury/illness/exacerbation and 1 comorbidity: T1DM  are also affecting patient's functional outcome.   REHAB POTENTIAL: Excellent  CLINICAL DECISION MAKING: Stable/uncomplicated  EVALUATION COMPLEXITY: Low   GOALS: Goals reviewed with patient? Yes  SHORT TERM GOALS: Target date: 06/18/2022   Patient will be independent with initial HEP.  Baseline: given Goal status: MET   LONG TERM GOALS: Target date: 07/16/2022   Patient will be independent with advanced/ongoing HEP to improve outcomes and carryover.  Baseline: needs progression Goal status: IN PROGRESS  2.  Patient will report 75% improvement in neck pain to improve QOL.  Baseline:  Goal status: IN PROGRESS  3.  Patient will demonstrate full pain free cervical ROM for safety with driving.  Baseline: see  objective, significant limitations Goal status: IN PROGRESS  4.  Patient will report <4% impairment on NDI  to demonstrate improved functional ability.  Baseline: 14% Goal status: IN PROGRESS  5.  Patient will demonstrate improved posture to decrease muscle imbalance. Baseline: slight forward head, decreased cervical lordosis, slightly rounded shoulders.  Goal status: IN PROGRESS  PLAN:  PT FREQUENCY: 1-2x/week  PT DURATION: 6 weeks  PLANNED INTERVENTIONS: Therapeutic exercises, Therapeutic activity, Neuromuscular re-education, Balance training, Gait training, Patient/Family education, Self Care, Joint mobilization, Dry Needling, Electrical stimulation, Spinal mobilization, Cryotherapy, Moist heat, Taping, Traction, Ultrasound, Manual therapy, and Re-evaluation  PLAN FOR NEXT SESSION: continue postural strengthening and mobility, manual therapy,  modalities PRN.  Not interested in DN at this time.    Artist Pais, PTA 07/02/2022, 10:16 AM

## 2022-07-07 ENCOUNTER — Encounter: Payer: 59 | Admitting: Physical Therapy

## 2022-07-09 ENCOUNTER — Ambulatory Visit: Payer: 59 | Admitting: Physical Therapy

## 2022-07-09 ENCOUNTER — Encounter: Payer: Self-pay | Admitting: Physical Therapy

## 2022-07-09 DIAGNOSIS — R252 Cramp and spasm: Secondary | ICD-10-CM

## 2022-07-09 DIAGNOSIS — M542 Cervicalgia: Secondary | ICD-10-CM

## 2022-07-09 DIAGNOSIS — R293 Abnormal posture: Secondary | ICD-10-CM

## 2022-07-09 NOTE — Therapy (Addendum)
OUTPATIENT PHYSICAL THERAPY TREATMENT   Patient Name: Maria White MRN: [id number] DOB:06/29/1966, 56 y.o., female Today's Date: 07/09/2022   PT End of Session - 07/09/22 1021     Visit Number 5    Number of Visits 12    Date for PT Re-Evaluation 07/16/22    PT Start Time 1020    PT Stop Time 1100    PT Time Calculation (min) 40 min    Activity Tolerance Patient tolerated treatment well    Behavior During Therapy [facility] for tasks assessed/performed               Past Medical History:  Diagnosis Date   Anemia    resolved   Diabetes mellitus    age 74   Elevated prolactin level    Pituitary adenoma ([place]) 9/11   5 mm   Past Surgical History:  Procedure Laterality Date   COLPOSCOPY     ENDOMETRIAL ABLATION  8/11   her option   LASER ABLATION OF THE CERVIX     Patient Active Problem List   Diagnosis Date Noted   Nonproliferative diabetic retinopathy ([place]) 02/26/2021   Tennis elbow 02/03/2011   Fibrocystic breast 97/94/8016   DM w/o complication type I ([place]) 01/26/2011    PCP: Lamar Blinks, MD  REFERRING PROVIDER: Glennon Mac, DO  REFERRING DIAG: M54.2 (ICD-10-CM) - Neck pain S16.1XXA (ICD-10-CM) - Strain of cervical portion of both trapezius muscles  THERAPY DIAG:  Cervicalgia  Cramp and spasm  Abnormal posture  Rationale for Evaluation and Treatment: Rehabilitation  ONSET DATE: Dec 23, 2021  SUBJECTIVE:                                                                                                                                                                                                         SUBJECTIVE STATEMENT: Doing better, can turn her head both directions now without pain.   Just a slight pain with extension now.   PERTINENT HISTORY:  T1DM  PAIN:  Are you having pain? Yes: NPRS scale: 1/10 Pain location: neck Pain description: twinge Aggravating factors: turning neck, looking up, moving quickly Relieving factors:  sitting still   PRECAUTIONS: None  WEIGHT BEARING RESTRICTIONS: No  FALLS:  Has patient fallen in last 6 months? No  LIVING ENVIRONMENT: Lives with: lives with their spouse Lives in: House/apartment Stairs: Yes: Internal: 15 steps; on right going up and External: 3 steps; on right going up Has following equipment at home: None  OCCUPATION: works for [employer]  PLOF: Independent and Leisure: walk, lift  weights at gym  PATIENT GOALS: to be normal again, to be able to turn my head without turning whole body  NEXT MD VISIT:  06/22/2022  OBJECTIVE:   DIAGNOSTIC FINDINGS:  Xray cervical spine 01/15/22 - FINDINGS: There is no evidence of cervical spine fracture or prevertebral soft tissue swelling. Alignment is normal. Minimal anterior osteophytosis is identified at C4 and C5.   IMPRESSION: No acute fracture or dislocation.   PATIENT SURVEYS:  NDI 7/50 = 14% impairment.   COGNITION: Overall cognitive status: Within functional limits for tasks assessed  SENSATION: WFL  POSTURE: rounded shoulders and forward head  PALPATION: Tenderness in suboccipitals and cervical paraspinals bil, tightness throughout bil UT and L/S.    CERVICAL ROM:   Active ROM AROM (deg) eval  Flexion 18 *  Extension 20*   Right lateral flexion 12 *  Left lateral flexion 14 *   Right rotation 35 *  Left rotation 35 *   (Blank rows = not tested) *very slow guarded movementw with pain at end range.   UPPER EXTREMITY ROM:  WNL, symmetric and painfree bil, noted slight tightness bil with flexion.    UPPER EXTREMITY MMT: 5/5 bil, slightly decreased grip strength bil.    CERVICAL SPECIAL TESTS:  Spurling's test: Negative and Sharp pursor's test: Negative  FUNCTIONAL TESTS:  NA  TODAY'S TREATMENT:                                                                                                                              DATE:  07/09/2022 Therapeutic Exercise: to improve strength and  mobility.  Demo, verbal and tactile cues throughout for technique. UBE L1 x 6 min (40f3b) Rows RTB x 20  Bil shoulder extension RTB x 20  D2 diagonalswith RTB x 10 bil  Standing ER  RTB x 20 against doorframe Standing open books x 10 bil Wall angels x 15 Wall push-ups x 2-  SNAGS with pillow case into extension Demo - pec stretch in doorway Manual Therapy: to decrease muscle spasm and pain and improve mobility STM/TPR to cervical paraspinals, NAGS into rotation, gentle cervical traction, UPA/PA mobs cervical spine, MFR to bil UT.  07/02/22 Therapeutic Exercise: to improve strength and mobility.  Demo, verbal and tactile cues throughout for technique. UBE L1 x 6 min (351fb) Suboccipital stretch 3x15" Cervical extension with pillow case x 10 Cervical rotation with pillow case x 10 Seated row 15lb high grip 2x10 Standing horizontal RTB 2x10  Standing mid row RTB x 20 Standing ER RTB x 10 against doorframe  Manual Therapy: STM to R suboccipitals, L UT  06/30/2022 Therapeutic Exercise: to improve strength and mobility.  Demo, verbal and tactile cues throughout for technique. UBE L1 x 6 min (24f102f) Cervical extension with pillow case x 10 Cervical rotation with pillow case x 10 S/L open books x 10 bil  Thoracic extensions on foam roller (crosswise) x 10 Thoracic mobilization on foam roller with arm  flexion x 10 bil Cat cows x 10 Child pose x 3 Bird dogs x 10 Rows 20# 2 x 10 Lat pulls 20# x 10 seated, x 10 standing Chest press 10# x 10 Manual Therapy: to decrease muscle spasm and pain and improve mobility STM/TPR to cervical paraspinals, NAGS into rotation, gentle cervical traction, UPA/PA mobs cervical spine, MFR to bil UT.   06/10/22 Therapeutic Exercise: to improve strength and mobility.  Demo, verbal and tactile cues throughout for technique. UBE L1.0 3 min fwd/ 68mn back Review of HEP exercises Standing row 2x10 RTB Standing shoulder ext RTB 2x10 Lat pulls 20#  2x10 Seated row 25# x 20 reps  Manual Therapy: STM to L UT and cervical paraspinals  06/04/2022 Therapeutic Exercise: to improve strength and mobility.  Demo, verbal and tactile cues throughout for technique.  - Seated Cervical Retraction 5 reps - 3 sec  hold - Seated Scapular Retraction  5 reps - 5 sec  hold - Seated Shoulder Shrug Circles AROM Backward  - 5 reps - Supine Chin Tucks on Pillow -  10 reps - 3 sec  hold - Cervical Extension AROM with Pillowcase  10 reps  PATIENT EDUCATION:  Education details: HEP update  06/30/22, 07/09/22 Person educated: Patient Education method: Explanation, Demonstration, Verbal cues, and Handouts Education comprehension: verbalized understanding and returned demonstration  HOME EXERCISE PROGRAM: Access Code: [email or web address]  ASSESSMENT:  CLINICAL IMPRESSION: TMARCINA KINNISONreports continued improvement, more tightness than pain now.  She still demonstrates tightness in pecs and decreased shoulder ROM bil with wall angels and standing open books.  Progressed exercises to continue scapular strengthening, added pec stretch to HEP, followed by manual therapy to decrease tightness, noted R levator scapulae is still tight.  TJERIKA WALEScontinues to demonstrate potential for improvement and would benefit from continued skilled therapy to address impairments.      OBJECTIVE IMPAIRMENTS: decreased mobility, decreased ROM, hypomobility, increased fascial restrictions, increased muscle spasms, improper body mechanics, postural dysfunction, and pain.   ACTIVITY LIMITATIONS: carrying, lifting, and bending  PARTICIPATION LIMITATIONS: driving, shopping, community activity, and occupation  PERSONAL FACTORS: Time since onset of injury/illness/exacerbation and 1 comorbidity: T1DM  are also affecting patient's functional outcome.   REHAB POTENTIAL: Excellent  CLINICAL DECISION MAKING: Stable/uncomplicated  EVALUATION COMPLEXITY: Low   GOALS: Goals reviewed  with patient? Yes  SHORT TERM GOALS: Target date: 06/18/2022   Patient will be independent with initial HEP.  Baseline: given Goal status: MET   LONG TERM GOALS: Target date: 07/16/2022   Patient will be independent with advanced/ongoing HEP to improve outcomes and carryover.  Baseline: needs progression Goal status: IN PROGRESS  2.  Patient will report 75% improvement in neck pain to improve QOL.  Baseline:  Goal status: IN PROGRESS  3.  Patient will demonstrate full pain free cervical ROM for safety with driving.  Baseline: see objective, significant limitations Goal status: IN PROGRESS  4.  Patient will report <4% impairment on NDI  to demonstrate improved functional ability.  Baseline: 14% Goal status: IN PROGRESS  5.  Patient will demonstrate improved posture to decrease muscle imbalance. Baseline: slight forward head, decreased cervical lordosis, slightly rounded shoulders.  Goal status: IN PROGRESS  PLAN:  PT FREQUENCY: 1-2x/week  PT DURATION: 6 weeks  PLANNED INTERVENTIONS: Therapeutic exercises, Therapeutic activity, Neuromuscular re-education, Balance training, Gait training, Patient/Family education, Self Care, Joint mobilization, Dry Needling, Electrical stimulation, Spinal mobilization, Cryotherapy, Moist heat, Taping, Traction, Ultrasound, Manual therapy, and Re-evaluation  PLAN FOR NEXT  SESSION: continue postural strengthening and mobility, manual therapy,  modalities PRN.  Not interested in DN at this time.    Rennie Natter, PT, DPT  07/09/2022, 12:11 PM   PHYSICAL THERAPY DISCHARGE SUMMARY  Visits from Start of Care: 5  Current functional level related to goals / functional outcomes: Decreased neck pain   Remaining deficits: tightness   Education / Equipment: HEP    Plan: Patient is being discharged due to not returning to PT after 07/09/2022.       Rennie Natter, PT, DPT 4:52 PM 09/03/2022

## 2022-07-11 NOTE — Progress Notes (Unsigned)
[place] at [facility] [address], [place], [place] [address] [phone]  Date:  07/15/2022   Name:  Maria White   DOB:  1965/08/08   MRN:  [id number]  PCP:  Darreld Mclean, MD    Chief Complaint: No chief complaint on file.   History of Present Illness:  Maria White is a 56 y.o. very pleasant female patient who presents with the following:  Here today to follow-up diabetes Last seen by myself 8/22- History of type 1 diabetes, pituitary adenoma  Shingrix Foot exam Urine micro can be updated Labs can be updated Pap Flu shot   Diabetes is managed by endocrinology - Dr Chalmers Cater Her GYN is Dr Talbert Nan    Patient Active Problem List   Diagnosis Date Noted   Nonproliferative diabetic retinopathy ([place]) 02/26/2021   Tennis elbow 02/03/2011   Fibrocystic breast 01/74/9449   DM w/o complication type I ([place]) 01/26/2011    Past Medical History:  Diagnosis Date   Anemia    resolved   Diabetes mellitus    age 72   Elevated prolactin level    Pituitary adenoma ([place]) 9/11   5 mm    Past Surgical History:  Procedure Laterality Date   COLPOSCOPY     ENDOMETRIAL ABLATION  8/11   her option   LASER ABLATION OF THE CERVIX      Social History   Tobacco Use   Smoking status: Never   Smokeless tobacco: Never  Vaping Use   Vaping Use: Never used  Substance Use Topics   Alcohol use: No   Drug use: No    Family History  Problem Relation Age of Onset   Breast cancer Maternal Grandmother 56       and great grandmother   Hypertension Mother    Breast cancer Mother 49   Breast cancer Other    Colon cancer Neg Hx    Esophageal cancer Neg Hx    Rectal cancer Neg Hx    Stomach cancer Neg Hx     No Known Allergies  Medication list has been reviewed and updated.  Current Outpatient Medications on File Prior to Visit  Medication Sig Dispense Refill   Continuous Blood Gluc Sensor (DEXCOM G6 SENSOR) MISC  SMARTSIG:1 Each Topical Every 10 Days     cyclobenzaprine (FLEXERIL) 5 MG tablet Take 1 tablet (5 mg total) by mouth 3 (three) times daily as needed for muscle spasms. 20 tablet 0   cyclobenzaprine (FLEXERIL) 5 MG tablet Take 1 tablet (5 mg total) by mouth 3 (three) times daily as needed for muscle spasms. 30 tablet 0   glucose blood (ONE TOUCH ULTRA TEST) test strip Use as instructed test 8 to 10 times a day 700 each 3   insulin lispro (HUMALOG) 100 UNIT/ML injection Sliding scale 3 mL 3   Insulin Syringe-Needle U-100 (B-D INS SYR ULTRAFINE .5CC/30G) 30G X 1/2" 0.5 ML MISC Use as directed to administer insulin 600 each 3   meloxicam (MOBIC) 15 MG tablet Take 1 tablet (15 mg total) by mouth daily. 30 tablet 0   meloxicam (MOBIC) 15 MG tablet Take 1 tablet (15 mg total) by mouth daily. 30 tablet 0   methocarbamol (ROBAXIN) 500 MG tablet Take 1 tablet (500 mg total) by mouth every 6 (six) hours as needed for muscle spasms. (Patient not taking: Reported on 06/04/2022) 40 tablet 0   Multiple Vitamins-Minerals (MULTIVITAMIN ADULT PO) Take  1 tablet by mouth daily.     Specialty Vitamins Products (COLLAGEN ULTRA) CAPS See admin instructions.     TRESIBA FLEXTOUCH 100 UNIT/ML SOPN FlexTouch Pen as directed.     No current facility-administered medications on file prior to visit.    Review of Systems:  As per HPI- otherwise negative.   Physical Examination: There were no vitals filed for this visit. There were no vitals filed for this visit. There is no height or weight on file to calculate BMI. Ideal Body Weight:    GEN: no acute distress. HEENT: Atraumatic, Normocephalic.  Ears and Nose: No external deformity. CV: RRR, No M/G/R. No JVD. No thrill. No extra heart sounds. PULM: CTA B, no wheezes, crackles, rhonchi. No retractions. No resp. distress. No accessory muscle use. ABD: S, NT, ND, +BS. No rebound. No HSM. EXTR: No c/c/e PSYCH: Normally interactive. Conversant.    Assessment and  Plan: ***  Signed Lamar Blinks, MD

## 2022-07-15 ENCOUNTER — Ambulatory Visit: Payer: 59 | Admitting: Family Medicine

## 2022-07-15 ENCOUNTER — Encounter: Payer: Self-pay | Admitting: Family Medicine

## 2022-07-15 VITALS — BP 120/64 | HR 85 | Temp 97.9°F | Resp 18 | Ht 64.5 in | Wt 149.2 lb

## 2022-07-15 DIAGNOSIS — D352 Benign neoplasm of pituitary gland: Secondary | ICD-10-CM

## 2022-07-15 DIAGNOSIS — R5383 Other fatigue: Secondary | ICD-10-CM

## 2022-07-15 DIAGNOSIS — Z1329 Encounter for screening for other suspected endocrine disorder: Secondary | ICD-10-CM

## 2022-07-15 DIAGNOSIS — Z130 Encounter for screening for diseases of the blood and blood-forming organs and certain disorders involving the immune mechanism: Secondary | ICD-10-CM | POA: Diagnosis not present

## 2022-07-15 DIAGNOSIS — Z13220 Encounter for screening for lipoid disorders: Secondary | ICD-10-CM

## 2022-07-15 DIAGNOSIS — E113299 Type 2 diabetes mellitus with mild nonproliferative diabetic retinopathy without macular edema, unspecified eye: Secondary | ICD-10-CM

## 2022-07-15 DIAGNOSIS — E109 Type 1 diabetes mellitus without complications: Secondary | ICD-10-CM | POA: Diagnosis not present

## 2022-07-15 LAB — LIPID PANEL
Cholesterol: 205 mg/dL — ABNORMAL HIGH (ref 0–200)
HDL: 74.3 mg/dL (ref >39.00)
LDL Cholesterol: 111 mg/dL — ABNORMAL HIGH (ref 0–99)
NonHDL: 130.63
Total CHOL/HDL Ratio: 3
Triglycerides: 99 mg/dL (ref 0.0–149.0)
VLDL: 19.8 mg/dL (ref 0.0–40.0)

## 2022-07-15 LAB — MICROALBUMIN / CREATININE URINE RATIO
Creatinine,U: 149 mg/dL
Microalb Creat Ratio: 0.5 mg/g (ref 0.0–30.0)
Microalb, Ur: <0.7 mg/dL (ref 0.0–1.9)

## 2022-07-15 LAB — CBC
HCT: 40.7 % (ref 36.0–46.0)
Hemoglobin: 13.7 g/dL (ref 12.0–15.0)
MCHC: 33.7 g/dL (ref 30.0–36.0)
MCV: 88.1 fl (ref 78.0–100.0)
Platelets: 254 10*3/uL (ref 150.0–400.0)
RBC: 4.62 Mil/uL (ref 3.87–5.11)
RDW: 13.2 % (ref 11.5–15.5)
WBC: 5 10*3/uL (ref 4.0–10.5)

## 2022-07-15 LAB — COMPREHENSIVE METABOLIC PANEL
ALT: 8 U/L (ref 0–35)
AST: 11 U/L (ref 0–37)
Albumin: 4.4 g/dL (ref 3.5–5.2)
Alkaline Phosphatase: 57 U/L (ref 39–117)
BUN: 12 mg/dL (ref 6–23)
CO2: 32 mEq/L (ref 19–32)
Calcium: 9.2 mg/dL (ref 8.4–10.5)
Chloride: 102 mEq/L (ref 96–112)
Creatinine, Ser: 0.76 mg/dL (ref 0.40–1.20)
GFR: 87.19 mL/min (ref >60.00)
Glucose, Bld: 66 mg/dL — ABNORMAL LOW (ref 70–99)
Potassium: 4 mEq/L (ref 3.5–5.1)
Sodium: 139 mEq/L (ref 135–145)
Total Bilirubin: 0.5 mg/dL (ref 0.2–1.2)
Total Protein: 6.6 g/dL (ref 6.0–8.3)

## 2022-07-15 LAB — VITAMIN D 25 HYDROXY (VIT D DEFICIENCY, FRACTURES): VITD: 29.58 ng/mL — ABNORMAL LOW (ref 30.00–100.00)

## 2022-07-15 LAB — HEMOGLOBIN A1C: Hgb A1c MFr Bld: 6.2 % (ref 4.6–6.5)

## 2022-07-15 LAB — TSH: TSH: 2.24 u[IU]/mL (ref 0.35–5.50)

## 2022-07-15 MED ORDER — PEN NEEDLES 30G X 5 MM MISC: 1.0000 | Freq: Two times a day (BID) | 3 refills | Status: DC

## 2022-07-15 MED ORDER — ONETOUCH ULTRA VI STRP: ORAL_STRIP | 3 refills | Status: AC

## 2022-07-15 NOTE — Patient Instructions (Signed)
Good to see you today- I will be in touch with your labs asap We will need to get you a new endocrinology office- I am glad to help out for now but I am not comfortable managing your type 1 diabetes long term  Recommend flu shot, tetanus, covid booster if not done, and shingles at your convenience  Take care!

## 2022-07-16 ENCOUNTER — Encounter: Payer: Self-pay | Admitting: Family Medicine

## 2022-07-16 LAB — PROLACTIN: Prolactin: 13.7 ng/mL

## 2022-09-09 DIAGNOSIS — M7712 Lateral epicondylitis, left elbow: Secondary | ICD-10-CM | POA: Insufficient documentation

## 2022-09-22 NOTE — Patient Instructions (Incomplete)
I hope you feel much better soon! Try the nasal spray 3 or even 4 times a day as needed to help dry up postnasal drainage.  The albuterol inhaler may also be helpful for any wheezing or tightness in your chest.  However, if these remedies are not helping you within a few days please let me know, I have some other ideas we could try

## 2022-09-22 NOTE — Progress Notes (Unsigned)
[place] at [facility] [address] [place], [place] [address] [phone]  Date:  09/23/2022   Name:  Maria White   DOB:  Jul 16, 1966   MRN:  [id number]  PCP:  Darreld Mclean, MD    Chief Complaint: No chief complaint on file.   History of Present Illness:  Maria White is a 57 y.o. very pleasant female patient who presents with the following:  Patient seen today with concern of illness.  History of type 1 diabetes, pituitary adenoma Most recent visit with myself was in December Her type 1 diabetes is managed by endocrinology, Dr. Chalmers Cater -at that time she mentioned potentially find a new provider due to some frustrations not with Dr. Chalmers Cater herself that with the office.  It looks like [facility] reached out to her but did not hear back  Pap screening COVID booster Eye exam Shingrix Tetanus  Patient Active Problem List   Diagnosis Date Noted   Nonproliferative diabetic retinopathy ([place]) 02/26/2021   Tennis elbow 02/03/2011   Fibrocystic breast [id number]   DM w/o complication type I ([place]) 01/26/2011    Past Medical History:  Diagnosis Date   Anemia    resolved   Diabetes mellitus    age 4   Elevated prolactin level    Pituitary adenoma ([place]) 9/11   5 mm    Past Surgical History:  Procedure Laterality Date   COLPOSCOPY     ENDOMETRIAL ABLATION  8/11   her option   LASER ABLATION OF THE CERVIX      Social History   Tobacco Use   Smoking status: Never   Smokeless tobacco: Never  Vaping Use   Vaping Use: Never used  Substance Use Topics   Alcohol use: No   Drug use: No    Family History  Problem Relation Age of Onset   Breast cancer Maternal Grandmother 46       and great grandmother   Hypertension Mother    Breast cancer Mother 59   Breast cancer Other    Colon cancer Neg Hx    Esophageal cancer Neg Hx    Rectal cancer Neg Hx    Stomach cancer Neg Hx     No Known  Allergies  Medication list has been reviewed and updated.  Current Outpatient Medications on File Prior to Visit  Medication Sig Dispense Refill   Continuous Blood Gluc Sensor (DEXCOM G6 SENSOR) MISC SMARTSIG:1 Each Topical Every 10 Days     glucose blood (ONE TOUCH ULTRA TEST) test strip Use as instructed test 8 to 10 times a day 700 each 3   glucose blood (ONETOUCH ULTRA) test strip Use as instructed 150 each 3   insulin lispro (HUMALOG) 100 UNIT/ML injection Sliding scale 3 mL 3   Insulin Pen Needle (PEN NEEDLES) 30G X 5 MM MISC 1 each by Does not apply route in the morning and at bedtime. 200 each 3   Insulin Syringe-Needle U-100 (B-D INS SYR ULTRAFINE .5CC/30G) 30G X 1/2" 0.5 ML MISC Use as directed to administer insulin 600 each 3   meloxicam (MOBIC) 15 MG tablet Take 1 tablet (15 mg total) by mouth daily. 30 tablet 0   methocarbamol (ROBAXIN) 500 MG tablet Take 1 tablet (500 mg total) by mouth every 6 (six) hours as needed for muscle spasms. 40 tablet 0   Multiple Vitamins-Minerals (MULTIVITAMIN ADULT PO) Take 1 tablet by mouth daily.  Specialty Vitamins Products (COLLAGEN ULTRA) CAPS See admin instructions.     TRESIBA FLEXTOUCH 100 UNIT/ML SOPN FlexTouch Pen as directed.     No current facility-administered medications on file prior to visit.    Review of Systems:  As per HPI- otherwise negative.   Physical Examination: There were no vitals filed for this visit. There were no vitals filed for this visit. There is no height or weight on file to calculate BMI. Ideal Body Weight:    GEN: no acute distress. HEENT: Atraumatic, Normocephalic.  Ears and Nose: No external deformity. CV: RRR, No M/G/R. No JVD. No thrill. No extra heart sounds. PULM: CTA B, no wheezes, crackles, rhonchi. No retractions. No resp. distress. No accessory muscle use. ABD: S, NT, ND, +BS. No rebound. No HSM. EXTR: No c/c/e PSYCH: Normally interactive. Conversant.    Assessment and  Plan: ***  Signed Lamar Blinks, MD

## 2022-09-23 ENCOUNTER — Ambulatory Visit: Payer: 59 | Admitting: Family Medicine

## 2022-09-23 VITALS — BP 126/72 | HR 83 | Temp 97.5°F | Resp 16 | Ht 65.0 in | Wt 142.4 lb

## 2022-09-23 DIAGNOSIS — R0982 Postnasal drip: Secondary | ICD-10-CM

## 2022-09-23 DIAGNOSIS — Z23 Encounter for immunization: Secondary | ICD-10-CM

## 2022-09-23 MED ORDER — ALBUTEROL SULFATE HFA 108 (90 BASE) MCG/ACT IN AERS: 2.0000 | INHALATION_SPRAY | Freq: Four times a day (QID) | RESPIRATORY_TRACT | 5 refills | Status: DC | PRN

## 2022-09-23 MED ORDER — IPRATROPIUM BROMIDE 0.03 % NA SOLN: 2.0000 | Freq: Three times a day (TID) | NASAL | 5 refills | Status: DC

## 2022-09-30 ENCOUNTER — Ambulatory Visit: Payer: 59 | Admitting: Family Medicine

## 2022-10-29 NOTE — Progress Notes (Signed)
57 y.o. [id number] Married White or Caucasian Not Hispanic or Latino female here for annual exam.  H/O endometrial ablation. She has continued to have monthly cycles since the ablation. It's always light, only needs a panty liner. Comes monthly, one missed month. She doesn't calendar it.  No hot flashes, no night sweats, no vaginal dryness. No dyspareunia.   Patient states her mom went through menopause in her late 6050's.    History of an elevated prolactin and a microadenoma. Needs yearly prolactin levels, last prolactin was in 12/23 and was 13.7. Stable brain MRI in 5/20.   Diabetes is well controlled, recent HgbA1C was 6.2  No bowel or bladder issues.   No LMP recorded. Patient has had an ablation.          Sexually active: Yes.    The current method of family planning is vasectomy.    Exercising: Yes.     Walking and light weights  Smoker:  no  Health Maintenance: Pap:  10/13/2018 WNL Hr HPV Neg; 08/30/15 WNL Hr HPV Neg  History of abnormal Pap:  yes colpo 30 years ago, she did have surgery on her cervix.   MMG:  12/24/21 Density C Bi-rads 1 neg  BMD:   none  Colonoscopy: 01/10/20 F/u every 5 years  TDaP:  unsure  Gardasil: none    reports that she has never smoked. She has never used smokeless tobacco. She reports that she does not drink alcohol and does not use drugs. She works part time for [employer]. Daughters are [phone], younger daughter is getting married in 2/25.   Past Medical History:  Diagnosis Date   Anemia    resolved   Diabetes mellitus    age 57   Elevated prolactin level    Pituitary adenoma 9/11   5 mm    Past Surgical History:  Procedure Laterality Date   COLPOSCOPY     ENDOMETRIAL ABLATION  8/11   her option   LASER ABLATION OF THE CERVIX      Current Outpatient Medications  Medication Sig Dispense Refill   glucose blood (ONE TOUCH ULTRA TEST) test strip Use as instructed test 8 to 10 times a day 700 each 3   glucose blood (ONETOUCH ULTRA)  test strip Use as instructed 150 each 3   insulin lispro (HUMALOG) 100 UNIT/ML injection Sliding scale 3 mL 3   Insulin Pen Needle (PEN NEEDLES) 30G X 5 MM MISC 1 each by Does not apply route in the morning and at bedtime. 200 each 3   Insulin Syringe-Needle U-100 (B-D INS SYR ULTRAFINE .5CC/30G) 30G X 1/2" 0.5 ML MISC Use as directed to administer insulin 600 each 3   ipratropium (ATROVENT) 0.03 % nasal spray Place 2 sprays into both nostrils 3 (three) times daily. 30 mL 5   Multiple Vitamins-Minerals (MULTIVITAMIN ADULT PO) Take 1 tablet by mouth daily.     Specialty Vitamins Products (COLLAGEN ULTRA) CAPS See admin instructions.     TOUJEO SOLOSTAR 300 UNIT/ML Solostar Pen Inject 1 Units into the skin once.     albuterol (VENTOLIN HFA) 108 (90 Base) MCG/ACT inhaler Inhale 2 puffs into the lungs every 6 (six) hours as needed for wheezing or shortness of breath. (Patient not taking: Reported on 11/12/2022) 1 each 5   No current facility-administered medications for this visit.    Family History  Problem Relation Age of Onset   Breast cancer Maternal Grandmother [phone]  and great grandmother   Hypertension Mother    Breast cancer Mother [phone]   Breast cancer Other    Colon cancer Neg Hx    Esophageal cancer Neg Hx    Rectal cancer Neg Hx    Stomach cancer Neg Hx   Her mother had negative genetic testing.   Review of Systems  All other systems reviewed and are negative.   Exam:   BP 110/62   Pulse 66   Ht 5' 4.5" (1.638 m)   Wt 138 lb (62.6 kg)   SpO2 100%   BMI 23.32 kg/m   Weight change: @WEIGHTCHANGE @ Height:   Height: 5' 4.5" (163.8 cm)  Ht Readings from Last 3 Encounters:  11/12/22 5' 4.5" (1.638 m)  09/23/22 5\' 5"  (1.651 m)  07/15/22 5' 4.5" (1.638 m)    General appearance: alert, cooperative and appears stated age Head: Normocephalic, without obvious abnormality, atraumatic Neck: no adenopathy, supple, symmetrical, trachea midline and thyroid normal to inspection and  palpation Lungs: clear to auscultation bilaterally Cardiovascular: regular rate and rhythm Breasts: normal appearance, no masses or tenderness Abdomen: soft, non-tender; non distended,  no masses,  no organomegaly Extremities: extremities normal, atraumatic, no cyanosis or edema Skin: Skin color, texture, turgor normal. No rashes or lesions Lymph nodes: Cervical, supraclavicular, and axillary nodes normal. No abnormal inguinal nodes palpated Neurologic: Grossly normal   Pelvic: External genitalia:  no lesions              Urethra:  normal appearing urethra with no masses, tenderness or lesions              Bartholins and Skenes: normal                 Vagina: normal appearing vagina with normal color and discharge, no lesions              Cervix: no lesions and + ectropion               Bimanual Exam:  Uterus:  normal size, contour, position, consistency, mobility, non-tender              Adnexa: no mass, fullness, tenderness               Rectovaginal: Confirms               Anus:  normal sphincter tone, no lesions  Carolynn Serveerra Myers, CMA chaperoned for the exam.  1. Well woman exam Discussed breast self exam Discussed calcium and vit D intake Labs with primary Mammogram due in 5/24 Colonoscopy UTD  2. Irregular menses She has had an endometrial ablation, has continued to have monthly spotting (not tracking it), skipped last mont -Calendar all bleeding, call if not c/w monthly cycle - Follicle stimulating hormone  3. Screening for cervical cancer Patient desires pap - Cytology - PAP  4. Family history of breast cancer Mom, MGM and MGGM all had breast cancer, all PMP. Mom had negative genetic testing. -We discussed a referral to genetics to calculate her breast cancer risk. She will consider and let me know if she wants to go. We discussed that if her risk of breast cancer was over 20% further evaluation would be recommended.   5. Immunization due - Tdap vaccine greater than  or equal to 7yo IM

## 2022-11-12 ENCOUNTER — Ambulatory Visit: Payer: 59 | Admitting: Obstetrics and Gynecology

## 2022-11-12 ENCOUNTER — Encounter: Payer: Self-pay | Admitting: Obstetrics and Gynecology

## 2022-11-12 ENCOUNTER — Other Ambulatory Visit (HOSPITAL_COMMUNITY)
Admission: RE | Admit: 2022-11-12 | Discharge: 2022-11-12 | Disposition: A | Discharge status: Home / Self Care | Payer: 59 | Source: Ambulatory Visit | Attending: Obstetrics and Gynecology | Admitting: Obstetrics and Gynecology

## 2022-11-12 VITALS — BP 110/62 | HR 66 | Ht 64.5 in | Wt 138.0 lb

## 2022-11-12 DIAGNOSIS — Z124 Encounter for screening for malignant neoplasm of cervix: Secondary | ICD-10-CM

## 2022-11-12 DIAGNOSIS — N926 Irregular menstruation, unspecified: Secondary | ICD-10-CM | POA: Diagnosis not present

## 2022-11-12 DIAGNOSIS — Z23 Encounter for immunization: Secondary | ICD-10-CM | POA: Diagnosis not present

## 2022-11-12 DIAGNOSIS — Z01419 Encounter for gynecological examination (general) (routine) without abnormal findings: Secondary | ICD-10-CM

## 2022-11-12 DIAGNOSIS — Z803 Family history of malignant neoplasm of breast: Secondary | ICD-10-CM

## 2022-11-12 NOTE — Patient Instructions (Signed)

## 2022-11-13 ENCOUNTER — Other Ambulatory Visit: Payer: Self-pay | Admitting: Obstetrics and Gynecology

## 2022-11-13 DIAGNOSIS — Z1231 Encounter for screening mammogram for malignant neoplasm of breast: Secondary | ICD-10-CM

## 2022-11-13 LAB — FOLLICLE STIMULATING HORMONE: FSH: 6.5 m[IU]/mL

## 2022-11-16 LAB — CYTOLOGY - PAP
Comment: NEGATIVE
Diagnosis: NEGATIVE
High risk HPV: NEGATIVE

## 2022-11-17 ENCOUNTER — Ambulatory Visit: Payer: 59 | Admitting: Medical

## 2022-11-17 VITALS — BP 120/56 | HR 84 | Temp 98.4°F | Resp 18 | Ht 64.5 in | Wt 135.4 lb

## 2022-11-17 DIAGNOSIS — J40 Bronchitis, not specified as acute or chronic: Secondary | ICD-10-CM

## 2022-11-17 DIAGNOSIS — J3489 Other specified disorders of nose and nasal sinuses: Secondary | ICD-10-CM | POA: Diagnosis not present

## 2022-11-17 DIAGNOSIS — R0982 Postnasal drip: Secondary | ICD-10-CM

## 2022-11-17 DIAGNOSIS — R059 Cough, unspecified: Secondary | ICD-10-CM | POA: Diagnosis not present

## 2022-11-17 DIAGNOSIS — R0981 Nasal congestion: Secondary | ICD-10-CM

## 2022-11-17 MED ORDER — AZELASTINE HCL 0.1 % NA SOLN: 2.0000 | Freq: Two times a day (BID) | NASAL | 0 refills | Status: DC

## 2022-11-17 MED ORDER — AMOXICILLIN-POT CLAVULANATE 875-125 MG PO TABS: 1.0000 | ORAL_TABLET | Freq: Two times a day (BID) | ORAL | 0 refills | Status: DC

## 2022-11-17 MED ORDER — BENZONATATE 100 MG PO CAPS: 100.0000 mg | ORAL_CAPSULE | Freq: Three times a day (TID) | ORAL | 0 refills | Status: DC | PRN

## 2022-11-17 MED ORDER — LEVOCETIRIZINE DIHYDROCHLORIDE 5 MG PO TABS: 5.0000 mg | ORAL_TABLET | Freq: Every evening | ORAL | 3 refills | Status: DC

## 2022-11-17 NOTE — Patient Instructions (Addendum)
Nasal congestion, PND (post-nasal drip). Concern for allergic rhinitis flare during market. Rx xyzal anthistamine and rx astelin. Can dc zyrtec and flonase.   Cough, unspecified type Benzonatate for cough.   Sinus pressure(concern for early sinus infection). Bronchitis with productive cough at times  Rx augmentin 875 mg twice daily. Can use albuterol if needed. If symptoms persisting despite use of augmentin can get chest xray.  Follow up 10-14 days or sooner if needed.

## 2022-11-17 NOTE — Progress Notes (Signed)
Subjective:    Patient ID: Maria White, female    DOB: 1965-10-30, 57 y.o.   MRN: [id number]  HPI Pt states had nasal congestion, st, pnd and some cough for about 11-05-2022. No obvious itchy eye or sneezing.  Pt has tried flonase, zyrtec, mucinex and delsym. Pt states husband was sick 6-7 before her symptoms started.  Pt was working Pharmacist, hospital. Pt saw medical Zenaida Niece on Sat 13th and got zpack. Her throat pain went away. She now has some chest congestion and some sinus pressure. Pt states feels mucus drain back of her throat and states feels like glob of peanut butter in back of throat at time.  No fever, no chills or sweats. She feels some fatigue. Pt has not been wheezing.  Working some outside and inside at [employer].    Review of Systems  Constitutional:  Negative for chills, fatigue and fever.  HENT:  Positive for congestion, postnasal drip and sinus pressure. Negative for drooling and sinus pain.   Respiratory:  Positive for cough. Negative for chest tightness, shortness of breath and wheezing.        Some coughing at night.  Cardiovascular:  Negative for chest pain and palpitations.  Gastrointestinal:  Negative for abdominal pain and anal bleeding.  Genitourinary:  Negative for dysuria and frequency.  Musculoskeletal:  Negative for back pain, myalgias and neck stiffness.  Skin:  Negative for rash.  Neurological:  Negative for dizziness, speech difficulty, weakness and light-headedness.  Hematological:  Negative for adenopathy. Does not bruise/bleed easily.  Psychiatric/Behavioral:  Negative for behavioral problems and decreased concentration.     Past Medical History:  Diagnosis Date   Anemia    resolved   Diabetes mellitus    age 89   Elevated prolactin level    Pituitary adenoma 9/11   5 mm     Social History   Socioeconomic History   Marital status: Married    Spouse name: Not on file   Number of children: Not on file   Years of education: Not on file    Highest education level: Not on file  Occupational History   Not on file  Tobacco Use   Smoking status: Never   Smokeless tobacco: Never  Vaping Use   Vaping Use: Never used  Substance and Sexual Activity   Alcohol use: No   Drug use: No   Sexual activity: Yes    Birth control/protection: Other-see comments    Comment: Vasectomy  Other Topics Concern   Not on file  Social History Narrative   Not on file   Social Determinants of Health   Financial Resource Strain: Not on file  Food Insecurity: Not on file  Transportation Needs: Not on file  Physical Activity: Not on file  Stress: Not on file  Social Connections: Not on file  Intimate Partner Violence: Not on file    Past Surgical History:  Procedure Laterality Date   COLPOSCOPY     ENDOMETRIAL ABLATION  8/11   her option   LASER ABLATION OF THE CERVIX      Family History  Problem Relation Age of Onset   Breast cancer Maternal Grandmother 91       and great grandmother   Hypertension Mother    Breast cancer Mother 15   Breast cancer Other    Colon cancer Neg Hx    Esophageal cancer Neg Hx    Rectal cancer Neg Hx    Stomach cancer Neg Hx  No Known Allergies  Current Outpatient Medications on File Prior to Visit  Medication Sig Dispense Refill   albuterol (VENTOLIN HFA) 108 (90 Base) MCG/ACT inhaler Inhale 2 puffs into the lungs every 6 (six) hours as needed for wheezing or shortness of breath. (Patient not taking: Reported on 11/12/2022) 1 each 5   glucose blood (ONE TOUCH ULTRA TEST) test strip Use as instructed test 8 to 10 times a day 700 each 3   glucose blood (ONETOUCH ULTRA) test strip Use as instructed 150 each 3   insulin lispro (HUMALOG) 100 UNIT/ML injection Sliding scale 3 mL 3   Insulin Pen Needle (PEN NEEDLES) 30G X 5 MM MISC 1 each by Does not apply route in the morning and at bedtime. 200 each 3   Insulin Syringe-Needle U-100 (B-D INS SYR ULTRAFINE .5CC/30G) 30G X 1/2" 0.5 ML MISC Use as  directed to administer insulin 600 each 3   ipratropium (ATROVENT) 0.03 % nasal spray Place 2 sprays into both nostrils 3 (three) times daily. 30 mL 5   Multiple Vitamins-Minerals (MULTIVITAMIN ADULT PO) Take 1 tablet by mouth daily.     Specialty Vitamins Products (COLLAGEN ULTRA) CAPS See admin instructions.     TOUJEO SOLOSTAR 300 UNIT/ML Solostar Pen Inject 1 Units into the skin once.     No current facility-administered medications on file prior to visit.    BP (!) 120/56   Pulse 84   Temp 98.4 F (36.9 C)   Resp 18   Ht 5' 4.5" (1.638 m)   Wt 135 lb 6.4 oz (61.4 kg)   SpO2 99%   BMI 22.88 kg/m        Objective:   Physical Exam  General Mental Status- Alert. General Appearance- Not in acute distress.   Skin General: Color- Normal Color. Moisture- Normal Moisture.  Neck Carotid Arteries- Normal color. Moisture- Normal Moisture. No carotid bruits. No JVD.  Chest and Lung Exam Auscultation: Breath Sounds:-Normal.  Cardiovascular Auscultation:Rythm- Regular. Murmurs & Other Heart Sounds:Auscultation of the heart reveals- No Murmurs.  Abdomen Inspection:-Inspeection Normal. Palpation/Percussion:Note:No mass. Palpation and Percussion of the abdomen reveal- Non Tender, Non Distended + BS, no rebound or guarding.   Neurologic Cranial Nerve exam:- CN III-XII intact(No nystagmus), symmetric smile. Strength:- 5/5 equal and symmetric strength both upper and lower extremities.    Heent- no sinus pressure. Boggy turbinates. +pnd.     Assessment & Plan:   Patient Instructions   Nasal congestion, PND (post-nasal drip). Concern for allergic rhinitis flare during market. Rx xyzal anthistamine and rx astelin. Can dc zyrtec and flonase.   Cough, unspecified type Benzonatate for cough.   Sinus pressure(concern for early sinus infecti. Bronchitis with productive cough at time.s  Rx augmentin 875 mg twice daily. Can use albuterol if needed. If symptoms persisting  despite use of augmentin can get chest xray.  Follow up 10-14 days or sooner if needed.    Esperanza Richters, PA-C

## 2022-12-28 ENCOUNTER — Ambulatory Visit
Admission: RE | Admit: 2022-12-28 | Discharge: 2022-12-28 | Disposition: A | Discharge status: Home / Self Care | Payer: 59 | Source: Ambulatory Visit | Attending: Obstetrics and Gynecology | Admitting: Obstetrics and Gynecology

## 2022-12-28 DIAGNOSIS — Z1231 Encounter for screening mammogram for malignant neoplasm of breast: Secondary | ICD-10-CM

## 2022-12-30 ENCOUNTER — Other Ambulatory Visit: Payer: Self-pay | Admitting: Obstetrics and Gynecology

## 2022-12-30 DIAGNOSIS — R928 Other abnormal and inconclusive findings on diagnostic imaging of breast: Secondary | ICD-10-CM

## 2023-01-11 ENCOUNTER — Ambulatory Visit
Admission: RE | Admit: 2023-01-11 | Discharge: 2023-01-11 | Disposition: A | Discharge status: Home / Self Care | Payer: 59 | Source: Ambulatory Visit | Attending: Obstetrics and Gynecology | Admitting: Obstetrics and Gynecology

## 2023-01-11 DIAGNOSIS — R928 Other abnormal and inconclusive findings on diagnostic imaging of breast: Secondary | ICD-10-CM

## 2023-02-03 ENCOUNTER — Other Ambulatory Visit (INDEPENDENT_AMBULATORY_CARE_PROVIDER_SITE_OTHER): Payer: 59

## 2023-02-03 ENCOUNTER — Other Ambulatory Visit: Payer: Self-pay | Admitting: "Endocrinology

## 2023-02-03 DIAGNOSIS — E109 Type 1 diabetes mellitus without complications: Secondary | ICD-10-CM

## 2023-02-03 LAB — COMPREHENSIVE METABOLIC PANEL
ALT: 12 U/L (ref 0–35)
AST: 14 U/L (ref 0–37)
Albumin: 4.4 g/dL (ref 3.5–5.2)
Alkaline Phosphatase: 56 U/L (ref 39–117)
BUN: 14 mg/dL (ref 6–23)
CO2: 32 mEq/L (ref 19–32)
Calcium: 9.7 mg/dL (ref 8.4–10.5)
Chloride: 101 mEq/L (ref 96–112)
Creatinine, Ser: 0.74 mg/dL (ref 0.40–1.20)
GFR: 89.67 mL/min (ref >60.00)
Glucose, Bld: 55 mg/dL — ABNORMAL LOW (ref 70–99)
Potassium: 4 mEq/L (ref 3.5–5.1)
Sodium: 141 mEq/L (ref 135–145)
Total Bilirubin: 0.5 mg/dL (ref 0.2–1.2)
Total Protein: 6.5 g/dL (ref 6.0–8.3)

## 2023-02-03 LAB — LIPID PANEL
Cholesterol: 214 mg/dL — ABNORMAL HIGH (ref 0–200)
HDL: 74.7 mg/dL (ref >39.00)
LDL Cholesterol: 128 mg/dL — ABNORMAL HIGH (ref 0–99)
NonHDL: 138.99
Total CHOL/HDL Ratio: 3
Triglycerides: 55 mg/dL (ref 0.0–149.0)
VLDL: 11 mg/dL (ref 0.0–40.0)

## 2023-02-03 LAB — HEMOGLOBIN A1C: Hgb A1c MFr Bld: 6.2 % (ref 4.6–6.5)

## 2023-02-03 LAB — MICROALBUMIN / CREATININE URINE RATIO
Creatinine,U: 154.1 mg/dL
Microalb Creat Ratio: 0.5 mg/g (ref 0.0–30.0)
Microalb, Ur: <0.7 mg/dL (ref 0.0–1.9)

## 2023-02-10 ENCOUNTER — Ambulatory Visit: Payer: 59 | Admitting: "Endocrinology

## 2023-02-10 ENCOUNTER — Encounter: Payer: Self-pay | Admitting: "Endocrinology

## 2023-02-10 VITALS — BP 120/80 | HR 70 | Ht 64.0 in | Wt 127.4 lb

## 2023-02-10 DIAGNOSIS — E109 Type 1 diabetes mellitus without complications: Secondary | ICD-10-CM | POA: Diagnosis not present

## 2023-02-10 DIAGNOSIS — E7800 Pure hypercholesterolemia, unspecified: Secondary | ICD-10-CM

## 2023-02-10 MED ORDER — GVOKE HYPOPEN 1-PACK 1 MG/0.2ML ~~LOC~~ SOAJ: 1.0000 mg | SUBCUTANEOUS | 2 refills | Status: AC | PRN

## 2023-02-10 MED ORDER — INSULIN LISPRO 100 UNIT/ML IJ SOLN: INTRAMUSCULAR | 2 refills | Status: DC

## 2023-02-10 MED ORDER — "BD INSULIN SYRINGE ULTRAFINE 30G X 1/2"" 0.5 ML MISC": 3 refills | Status: DC

## 2023-02-10 MED ORDER — DEXCOM G7 SENSOR MISC: 1.0000 | 1 refills | Status: AC

## 2023-02-10 NOTE — Patient Instructions (Signed)
Correction scale: Use in addition to your meal time/short acting insulin based on blood sugars as follows:  151 - 200: 1 unit 201 - 250: 2 units 251 - 300: 3 units 301 - 350: 4 units 351 - 400: 5 units   ____________  Goals of DM therapy:  Morning Fasting blood sugar: 80-140  Blood sugar before meals: 80-140 Bed time blood sugar: 100-150  A1C <7%, limited only by hypoglycemia  1.Diabetes medications and their side effects discussed, including hypoglycemia    2. Check blood glucose:  a) Always check blood sugars before driving. Please see below (under hypoglycemia) on how to manage b) Check a minimum of 3 times/day or more as needed when having symptoms of hypoglycemia.   c) Try to check blood glucose before sleeping/in the middle of the night to ensure that it is remaining stable and not dropping less than 100 d) Check blood glucose more often if sick  3. Diet: a) 3 meals per day schedule b: Restrict carbs to 60-70 grams (4 servings) per meal c) Colorful vegetables - 3 servings a day, and low sugar fruit 2 servings/day Plate control method: 1/4 plate protein, 1/4 starch, 1/2 green, yellow, or red vegetables d) Avoid carbohydrate snacks unless hypoglycemic episode, or increased physical activity  4. Regular exercise as tolerated, preferably 3 or more hours a week  5. Hypoglycemia: a)  Do not drive or operate machinery without first testing blood glucose to assure it is over 90 mg%, or if dizzy, lightheaded, not feeling normal, etc, or  if foot or leg is numb or weak. b)  If blood glucose less than 70, take four 5gm Glucose tabs or 15-30 gm Glucose gel.  Repeat every 15 min as needed until blood sugar is >100 mg/dl. If hypoglycemia persists then call 911.   6. Sick day management: a) Check blood glucose more often b) Continue usual therapy if blood sugars are elevated.   7. Contact the doctor immediately if blood glucose is frequently <60 mg/dl, or an episode of severe  hypoglycemia occurs (where someone had to give you glucose/  glucagon or if you passed out from a low blood glucose), or if blood glucose is persistently >350 mg/dl, for further management  8. A change in level of physical activity or exercise and a change in diet may also affect your blood sugar. Check blood sugars more often and call if needed.  Instructions: 1. Bring glucose meter, blood glucose records on every visit for review 2. Continue to follow up with primary care physician and other providers for medical care 3. Yearly eye  and foot exam 4. Please get blood work done prior to the next appointment

## 2023-02-10 NOTE — Progress Notes (Signed)
Outpatient Endocrinology Note Maria Glenolden, MD  02/10/23   Maria White 09/08/1965 [id number]  Referring Provider: Pearline Cables, MD Primary Care Provider: Pearline Cables, MD Reason for consultation: Subjective   Assessment & Plan  Maria White was seen today for follow-up.  Diagnoses and all orders for this visit:  Pure hypercholesterolemia  Controlled diabetes mellitus type 1 without complications (HCC)  Other orders -     Glucagon (GVOKE HYPOPEN 1-PACK) 1 MG/0.2ML SOAJ; Inject 1 mg into the skin as needed (low blood sugar with impaired consciousness). -     insulin lispro (HUMALOG) 100 UNIT/ML injection; Up to 50 units per day based on insulin-carb ratio -     Continuous Glucose Sensor (DEXCOM G7 SENSOR) MISC; 1 Device by Does not apply route continuous. -     Insulin Syringe-Needle U-100 (B-D INS SYR ULTRAFINE .5CC/30G) 30G X 1/2" 0.5 ML MISC; Use as directed to administer insulin    Diabetes Type I with no complications Lab Results  Component Value Date   GFR 89.67 02/03/2023   Hba1c goal less than 7, current Hba1c is  Lab Results  Component Value Date   HGBA1C 6.2 02/03/2023   Will recommend the following: Toujeo 20-22 units qam  Humalog IC 1:15, ISF 1:50>150, 15 min before meals   No known contraindications to any of above medications Glucagon explained and prescribed with refills on 02/10/23  -Last LD and Tg are as follows: Lab Results  Component Value Date   LDLCALC 128 (H) 02/03/2023    Lab Results  Component Value Date   TRIG 55.0 02/03/2023   -Chooses not to be on statin, counseled extensively  -Follow low fat diet and exercise   -Blood pressure goal <140/90 - Microalbumin/creatinine goal < 30 -Last MA/Cr is as follows: Lab Results  Component Value Date   MICROALBUR <0.7 02/03/2023   -on ACE/ARB  -diet changes including salt restriction -limit eating outside -counseled BP targets per standards of diabetes care -uncontrolled  blood pressure can lead to retinopathy, nephropathy and cardiovascular and atherosclerotic heart disease  Reviewed and counseled on: -A1C target -Blood sugar targets -Complications of uncontrolled diabetes  -Checking blood sugar before meals and bedtime and bring log next visit -All medications with mechanism of action and side effects -Hypoglycemia management: rule of 15's, Glucagon Emergency Kit and medical alert ID -low-carb low-fat plate-method diet -At least 20 minutes of physical activity per day -Annual dilated retinal eye exam and foot exam -compliance and follow up needs -follow up as scheduled or earlier if problem gets worse  Call if blood sugar is less than 70 or consistently above 250    Take a 15 gm snack of carbohydrate at bedtime before you go to sleep if your blood sugar is less than 100.    If you are going to fast after midnight for a test or procedure, ask your physician for instructions on how to reduce/decrease your insulin dose.    Call if blood sugar is less than 70 or consistently above 250  -Treating a low sugar by rule of 15  (15 gms of sugar every 15 min until sugar is more than 70) If you feel your sugar is low, test your sugar to be sure If your sugar is low (less than 70), then take 15 grams of a fast acting Carbohydrate (3-4 glucose tablets or glucose gel or 4 ounces of juice or regular soda) Recheck your sugar 15 min after treating low to make sure it  is more than 70 If sugar is still less than 70, treat again with 15 grams of carbohydrate          Don't drive the hour of hypoglycemia  If unconscious/unable to eat or drink by mouth, use glucagon injection or nasal spray baqsimi and call 911. Can repeat again in 15 min if still unconscious.  Return in about 3 months (around 05/13/2023).   I have reviewed current medications, nurse's notes, allergies, vital signs, past medical and surgical history, family medical history, and social history for this  encounter. Counseled patient on symptoms, examination findings, lab findings, imaging results, treatment decisions and monitoring and prognosis. The patient understood the recommendations and agrees with the treatment plan. All questions regarding treatment plan were fully answered.  Maria Anniston, MD  02/10/23    History of Present Illness Maria White is a 57 y.o. year old female who presents for evaluation of Type I diabetes mellitus.  Maria White was first diagnosed at 57. Diabetes education +  Home diabetes regimen: Toujeo 20-22 units qam  Humalog IC 1:15 tidac   COMPLICATIONS -  MI/Stroke -  retinopathy -  neuropathy -  nephropathy  SYMPTOMS REVIEWED - Polyuria - Weight loss - Blurred vision  BLOOD SUGAR DATA  CGM interpretation: At today's visit, we reviewed her CGM downloads. The full report is scanned in the media. Reviewing the CGM trends, BG are well controlled most of the day.   Physical Exam  BP 120/80   Pulse 70   Ht 5\' 4"  (1.626 m)   Wt 127 lb 6.4 oz (57.8 kg)   SpO2 99%   BMI 21.87 kg/m    Constitutional: well developed, well nourished Head: normocephalic, atraumatic Eyes: sclera anicteric, no redness Neck: supple Lungs: normal respiratory effort Neurology: alert and oriented Skin: dry, no appreciable rashes Musculoskeletal: no appreciable defects Psychiatric: normal mood and affect Diabetic Foot Exam - Simple   No data filed      Current Medications Patient's Medications  New Prescriptions   CONTINUOUS GLUCOSE SENSOR (DEXCOM G7 SENSOR) MISC    1 Device by Does not apply route continuous.   GLUCAGON (GVOKE HYPOPEN 1-PACK) 1 MG/0.2ML SOAJ    Inject 1 mg into the skin as needed (low blood sugar with impaired consciousness).   INSULIN LISPRO (HUMALOG) 100 UNIT/ML INJECTION    Up to 50 units per day based on insulin-carb ratio  Previous Medications   GLUCOSE BLOOD (ONE TOUCH ULTRA TEST) TEST STRIP    Use as instructed test 8 to 10  times a day   GLUCOSE BLOOD (ONETOUCH ULTRA) TEST STRIP    Use as instructed   INSULIN PEN NEEDLE (PEN NEEDLES) 30G X 5 MM MISC    1 each by Does not apply route in the morning and at bedtime.   MULTIPLE VITAMINS-MINERALS (MULTIVITAMIN ADULT PO)    Take 1 tablet by mouth daily.   TOUJEO SOLOSTAR 300 UNIT/ML SOLOSTAR PEN    Inject 1 Units into the skin once.  Modified Medications   Modified Medication Previous Medication   INSULIN SYRINGE-NEEDLE U-100 (B-D INS SYR ULTRAFINE .5CC/30G) 30G X 1/2" 0.5 ML MISC Insulin Syringe-Needle U-100 (B-D INS SYR ULTRAFINE .5CC/30G) 30G X 1/2" 0.5 ML MISC      Use as directed to administer insulin    Use as directed to administer insulin  Discontinued Medications   ALBUTEROL (VENTOLIN HFA) 108 (90 BASE) MCG/ACT INHALER    Inhale 2 puffs into the lungs every 6 (  six) hours as needed for wheezing or shortness of breath.   AMOXICILLIN-CLAVULANATE (AUGMENTIN) 875-125 MG TABLET    Take 1 tablet by mouth 2 (two) times daily.   AZELASTINE (ASTELIN) 0.1 % NASAL SPRAY    Place 2 sprays into both nostrils 2 (two) times daily. Use in each nostril as directed   BENZONATATE (TESSALON) 100 MG CAPSULE    Take 1 capsule (100 mg total) by mouth 3 (three) times daily as needed for cough.   INSULIN LISPRO (HUMALOG) 100 UNIT/ML INJECTION    Sliding scale   IPRATROPIUM (ATROVENT) 0.03 % NASAL SPRAY    Place 2 sprays into both nostrils 3 (three) times daily.   LEVOCETIRIZINE (XYZAL) 5 MG TABLET    Take 1 tablet (5 mg total) by mouth every evening.   SPECIALTY VITAMINS PRODUCTS (COLLAGEN ULTRA) CAPS    See admin instructions.    Allergies No Known Allergies  Past Medical History Past Medical History:  Diagnosis Date   Anemia    resolved   Diabetes mellitus    age 54   Elevated prolactin level    Pituitary adenoma (HCC) 9/11   5 mm    Past Surgical History Past Surgical History:  Procedure Laterality Date   COLPOSCOPY     ENDOMETRIAL ABLATION  8/11   her option    LASER ABLATION OF THE CERVIX      Family History family history includes Breast cancer in an other family member; Breast cancer (age of onset: 51) in her maternal grandmother; Breast cancer (age of onset: 54) in her mother; Hypertension in her mother.  Social History Social History   Socioeconomic History   Marital status: Married    Spouse name: Not on file   Number of children: Not on file   Years of education: Not on file   Highest education level: Not on file  Occupational History   Not on file  Tobacco Use   Smoking status: Never   Smokeless tobacco: Never  Vaping Use   Vaping status: Never Used  Substance and Sexual Activity   Alcohol use: No   Drug use: No   Sexual activity: Yes    Birth control/protection: Other-see comments    Comment: Vasectomy  Other Topics Concern   Not on file  Social History Narrative   Not on file   Social Determinants of Health   Financial Resource Strain: Not on file  Food Insecurity: Not on file  Transportation Needs: Not on file  Physical Activity: Not on file  Stress: Not on file  Social Connections: Unknown (12/09/2021)   Received from [facility]   Social Network    Social Network: Not on file  Intimate Partner Violence: Unknown (10/31/2021)   Received from [facility]   HITS    Physically Hurt: Not on file    Insult or Talk Down To: Not on file    Threaten Physical Harm: Not on file    Scream or Curse: Not on file    Lab Results  Component Value Date   HGBA1C 6.2 02/03/2023   HGBA1C 6.2 07/15/2022   HGBA1C 6.0 01/14/2021   Lab Results  Component Value Date   CHOL 214 (H) 02/03/2023   Lab Results  Component Value Date   HDL 74.70 02/03/2023   Lab Results  Component Value Date   LDLCALC 128 (H) 02/03/2023   Lab Results  Component Value Date   TRIG 55.0 02/03/2023   Lab Results  Component Value Date  CHOLHDL 3 02/03/2023   Lab Results  Component Value Date   CREATININE 0.74 02/03/2023   Lab  Results  Component Value Date   GFR 89.67 02/03/2023   Lab Results  Component Value Date   MICROALBUR <0.7 02/03/2023      Component Value Date/Time   NA 141 02/03/2023 0933   NA 144 07/10/2020 0000   K 4.0 02/03/2023 0933   CL 101 02/03/2023 0933   CO2 32 02/03/2023 0933   GLUCOSE 55 (L) 02/03/2023 0933   BUN 14 02/03/2023 0933   BUN 15 07/10/2020 0000   CREATININE 0.74 02/03/2023 0933   CALCIUM 9.7 02/03/2023 0933   PROT 6.5 02/03/2023 0933   ALBUMIN 4.4 02/03/2023 0933   AST 14 02/03/2023 0933   ALT 12 02/03/2023 0933   ALKPHOS 56 02/03/2023 0933   BILITOT 0.5 02/03/2023 0933   GFRNONAA 92.23 01/17/2020 0000   GFRAA  03/12/2010 0900    >60        The eGFR has been calculated using the MDRD equation. This calculation has not been validated in all clinical situations. eGFR's persistently <60 mL/min signify possible Chronic Kidney Disease.      Latest Ref Rng & Units 02/03/2023    9:33 AM 07/15/2022    9:47 AM 03/26/2021    3:01 PM  BMP  Glucose 70 - 99 mg/dL 55  66  253   BUN 6 - 23 mg/dL 14  12  18    Creatinine 0.40 - 1.20 mg/dL 6.64  4.03  4.74   Sodium 135 - 145 mEq/L 141  139  136   Potassium 3.5 - 5.1 mEq/L 4.0  4.0  4.0   Chloride 96 - 112 mEq/L 101  102  99   CO2 19 - 32 mEq/L 32  32  30   Calcium 8.4 - 10.5 mg/dL 9.7  9.2  8.8        Component Value Date/Time   WBC 5.0 07/15/2022 0947   RBC 4.62 07/15/2022 0947   HGB 13.7 07/15/2022 0947   HCT 40.7 07/15/2022 0947   PLT 254.0 07/15/2022 0947   MCV 88.1 07/15/2022 0947   MCHC 33.7 07/15/2022 0947   RDW 13.2 07/15/2022 0947   LYMPHSABS 1.8 04/02/2011 1613   MONOABS 0.5 04/02/2011 1613   EOSABS 0.1 04/02/2011 1613   BASOSABS 0.0 04/02/2011 1613     Parts of this note may have been dictated using voice recognition software. There may be variances in spelling and vocabulary which are unintentional. Not all errors are proofread. Please notify the Thereasa Parkin if any discrepancies are noted or if the  meaning of any statement is not clear.

## 2023-02-11 LAB — GAD65, IA-2, AND INSULIN AUTOANTIBODY SERUM
Glutamic Acid Decarb Ab: 13 IU/mL — ABNORMAL HIGH (ref <5)
IA-2 Antibody: <5.4 U/mL (ref <5.4)
Insulin Antibodies, Human: 6.7 U/mL — ABNORMAL HIGH (ref <0.4)

## 2023-02-11 LAB — C-PEPTIDE: C-Peptide: <0.1 ng/mL — ABNORMAL LOW (ref 0.80–3.85)

## 2023-02-15 ENCOUNTER — Other Ambulatory Visit: Payer: Self-pay

## 2023-02-15 DIAGNOSIS — E109 Type 1 diabetes mellitus without complications: Secondary | ICD-10-CM

## 2023-02-15 MED ORDER — INSULIN LISPRO 100 UNIT/ML IJ SOLN
INTRAMUSCULAR | 2 refills | Status: DC
Start: 2023-02-15 — End: 2023-04-07

## 2023-04-06 ENCOUNTER — Other Ambulatory Visit: Payer: Self-pay | Admitting: "Endocrinology

## 2023-04-06 DIAGNOSIS — E109 Type 1 diabetes mellitus without complications: Secondary | ICD-10-CM

## 2023-04-26 ENCOUNTER — Other Ambulatory Visit: Payer: 59

## 2023-05-03 ENCOUNTER — Ambulatory Visit: Payer: 59 | Admitting: "Endocrinology

## 2023-05-31 ENCOUNTER — Other Ambulatory Visit: Payer: 59

## 2023-06-03 ENCOUNTER — Ambulatory Visit: Payer: 59 | Admitting: "Endocrinology

## 2023-06-03 ENCOUNTER — Encounter: Payer: Self-pay | Admitting: "Endocrinology

## 2023-06-03 VITALS — Ht 64.0 in | Wt 128.4 lb

## 2023-06-03 DIAGNOSIS — E7800 Pure hypercholesterolemia, unspecified: Secondary | ICD-10-CM

## 2023-06-03 DIAGNOSIS — E10649 Type 1 diabetes mellitus with hypoglycemia without coma: Secondary | ICD-10-CM

## 2023-06-03 LAB — POCT GLYCOSYLATED HEMOGLOBIN (HGB A1C): Hemoglobin A1C: 5.5 % (ref 4.0–5.6)

## 2023-06-03 MED ORDER — DEXCOM G7 SENSOR MISC: 1.0000 | 2 refills | Status: DC

## 2023-06-03 MED ORDER — TOUJEO SOLOSTAR 300 UNIT/ML ~~LOC~~ SOPN: 18.0000 [IU] | PEN_INJECTOR | Freq: Once | SUBCUTANEOUS | 3 refills | Status: DC

## 2023-06-03 NOTE — Progress Notes (Addendum)
Outpatient Endocrinology Note Maria [place], MD  06/03/23   Maria White 1965-08-07 [id number]  Referring Provider: Pearline Cables, MD Primary Care Provider: Pearline Cables, MD Reason for consultation: Subjective   Assessment & Plan  Diagnoses and all orders for this visit:  Type 1 diabetes mellitus with hypoglycemia and without coma (HCC) -     POCT glycosylated hemoglobin (Hb A1C) -     TOUJEO SOLOSTAR 300 UNIT/ML Solostar Pen; Inject 18 Units into the skin once for 1 dose. -     Continuous Glucose Sensor (DEXCOM G7 SENSOR) MISC; 1 Device by Does not apply route continuous.  Pure hypercholesterolemia   Diabetes Type I with no complications Lab Results  Component Value Date   GFR 89.67 02/03/2023   Hba1c goal less than 7, current Hba1c is  Lab Results  Component Value Date   HGBA1C 5.5 06/03/2023   Will recommend the following: Toujeo 17-18 units qam  Humalog IC 1:15, ISF 1:50>150, 15 min before meals  BG <70 = no humalog If need humalog, has to be 15 min before meals (not after)  No known contraindications to any of above medications Glucagon explained and prescribed with refills on 02/10/23  -Last LD and Tg are as follows: Lab Results  Component Value Date   LDLCALC 128 (H) 02/03/2023    Lab Results  Component Value Date   TRIG 55.0 02/03/2023   -Chooses not to be on statin, counseled extensively  -Follow low fat diet and exercise   -Blood pressure goal <140/90 - Microalbumin/creatinine goal < 30 -Last MA/Cr is as follows: Lab Results  Component Value Date   MICROALBUR <0.7 02/03/2023   -on ACE/ARB  -diet changes including salt restriction -limit eating outside -counseled BP targets per standards of diabetes care -uncontrolled blood pressure can lead to retinopathy, nephropathy and cardiovascular and atherosclerotic heart disease  Reviewed and counseled on: -A1C target -Blood sugar targets -Complications of uncontrolled  diabetes  -Checking blood sugar before meals and bedtime and bring log next visit -All medications with mechanism of action and side effects -Hypoglycemia management: rule of 15's, Glucagon Emergency Kit and medical alert ID -low-carb low-fat plate-method diet -At least 20 minutes of physical activity per day -Annual dilated retinal eye exam and foot exam -compliance and follow up needs -follow up as scheduled or earlier if problem gets worse  Call if blood sugar is less than 70 or consistently above 250    Take a 15 gm snack of carbohydrate at bedtime before you go to sleep if your blood sugar is less than 100.    If you are going to fast after midnight for a test or procedure, ask your physician for instructions on how to reduce/decrease your insulin dose.    Call if blood sugar is less than 70 or consistently above 250  -Treating a low sugar by rule of 15  (15 gms of sugar every 15 min until sugar is more than 70) If you feel your sugar is low, test your sugar to be sure If your sugar is low (less than 70), then take 15 grams of a fast acting Carbohydrate (3-4 glucose tablets or glucose gel or 4 ounces of juice or regular soda) Recheck your sugar 15 min after treating low to make sure it is more than 70 If sugar is still less than 70, treat again with 15 grams of carbohydrate          Don't drive the hour of hypoglycemia  If unconscious/unable to eat or drink by mouth, use glucagon injection or nasal spray baqsimi and call 911. Can repeat again in 15 min if still unconscious.  Return in about 3 months (around 09/03/2023).   I have reviewed current medications, nurse's notes, allergies, vital signs, past medical and surgical history, family medical history, and social history for this encounter. Counseled patient on symptoms, examination findings, lab findings, imaging results, treatment decisions and monitoring and prognosis. The patient understood the recommendations and agrees with  the treatment plan. All questions regarding treatment plan were fully answered.  Maria Broaddus, MD  06/03/23    History of Present Illness Maria White is a 57 y.o. year old female who presents for follow up on of Type I diabetes mellitus.  Maria White was first diagnosed at 57. Diabetes education +  Home diabetes regimen: Toujeo 18-20 units qam (liked tresiba better, but insurance won't pay for it) Humalog IC 1:15, ISF 1:50>150, 15 min before meals   COMPLICATIONS -  MI/Stroke -  retinopathy -  neuropathy -  nephropathy   BLOOD SUGAR DATA  CGM interpretation: At today's visit, we reviewed her CGM downloads. The full report is scanned in the media. Reviewing the CGM trends, BG are well controlled most of the day with frequent lows, pre-meals/overnight..   Physical Exam  Ht 5\' 4"  (1.626 m)   Wt 128 lb 6.4 oz (58.2 kg)   BMI 22.04 kg/m    Constitutional: well developed, well nourished Head: normocephalic, atraumatic Eyes: sclera anicteric, no redness Neck: supple Lungs: normal respiratory effort Neurology: alert and oriented Skin: dry, no appreciable rashes Musculoskeletal: no appreciable defects Psychiatric: normal mood and affect Diabetic Foot Exam - Simple   No data filed      Current Medications Patient's Medications  New Prescriptions   CONTINUOUS GLUCOSE SENSOR (DEXCOM G7 SENSOR) MISC    1 Device by Does not apply route continuous.  Previous Medications   CONTINUOUS GLUCOSE SENSOR (DEXCOM G7 SENSOR) MISC    1 Device by Does not apply route continuous.   GLUCAGON (GVOKE HYPOPEN 1-PACK) 1 MG/0.2ML SOAJ    Inject 1 mg into the skin as needed (low blood sugar with impaired consciousness).   GLUCOSE BLOOD (ONE TOUCH ULTRA TEST) TEST STRIP    Use as instructed test 8 to 10 times a day   GLUCOSE BLOOD (ONETOUCH ULTRA) TEST STRIP    Use as instructed   INSULIN LISPRO (HUMALOG) 100 UNIT/ML INJECTION    INJECT SUBCUTANEOUSLY UP TO 50  UNITS DAILY BASED ON   INSULIN-CARB RATIO   INSULIN PEN NEEDLE (PEN NEEDLES) 30G X 5 MM MISC    1 each by Does not apply route in the morning and at bedtime.   INSULIN SYRINGE-NEEDLE U-100 (B-D INS SYR ULTRAFINE .5CC/30G) 30G X 1/2" 0.5 ML MISC    Use as directed to administer insulin   MULTIPLE VITAMINS-MINERALS (MULTIVITAMIN ADULT PO)    Take 1 tablet by mouth daily.  Modified Medications   Modified Medication Previous Medication   TOUJEO SOLOSTAR 300 UNIT/ML SOLOSTAR PEN TOUJEO SOLOSTAR 300 UNIT/ML Solostar Pen      Inject 18 Units into the skin once for 1 dose.    Inject 1 Units into the skin once.  Discontinued Medications   No medications on file    Allergies No Known Allergies  Past Medical History Past Medical History:  Diagnosis Date   Anemia    resolved   Diabetes mellitus    age 79  Elevated prolactin level    Pituitary adenoma (HCC) 9/11   5 mm    Past Surgical History Past Surgical History:  Procedure Laterality Date   COLPOSCOPY     ENDOMETRIAL ABLATION  8/11   her option   LASER ABLATION OF THE CERVIX      Family History family history includes Breast cancer in an other family member; Breast cancer (age of onset: 43) in her maternal grandmother; Breast cancer (age of onset: 88) in her mother; Hypertension in her mother.  Social History Social History   Socioeconomic History   Marital status: Married    Spouse name: Not on file   Number of children: Not on file   Years of education: Not on file   Highest education level: Not on file  Occupational History   Not on file  Tobacco Use   Smoking status: Never   Smokeless tobacco: Never  Vaping Use   Vaping status: Never Used  Substance and Sexual Activity   Alcohol use: No   Drug use: No   Sexual activity: Yes    Birth control/protection: Other-see comments    Comment: Vasectomy  Other Topics Concern   Not on file  Social History Narrative   Not on file   Social Determinants of Health   Financial Resource  Strain: Not on file  Food Insecurity: Not on file  Transportation Needs: Not on file  Physical Activity: Not on file  Stress: Not on file  Social Connections: Unknown (12/09/2021)   [facility] from [facility], [facility]   Social Network    Social Network: Not on file  Intimate Partner Violence: Unknown (10/31/2021)   Received from [facility], [facility]   HITS    Physically Hurt: Not on file    Insult or Talk Down To: Not on file    Threaten Physical Harm: Not on file    Scream or Curse: Not on file    Lab Results  Component Value Date   HGBA1C 5.5 06/03/2023   HGBA1C 6.2 02/03/2023   HGBA1C 6.2 07/15/2022   Lab Results  Component Value Date   CHOL 214 (H) 02/03/2023   Lab Results  Component Value Date   HDL 74.70 02/03/2023   Lab Results  Component Value Date   LDLCALC 128 (H) 02/03/2023   Lab Results  Component Value Date   TRIG 55.0 02/03/2023   Lab Results  Component Value Date   CHOLHDL 3 02/03/2023   Lab Results  Component Value Date   CREATININE 0.74 02/03/2023   Lab Results  Component Value Date   GFR 89.67 02/03/2023   Lab Results  Component Value Date   MICROALBUR <0.7 02/03/2023      Component Value Date/Time   NA 141 02/03/2023 0933   NA 144 07/10/2020 0000   K 4.0 02/03/2023 0933   CL 101 02/03/2023 0933   CO2 32 02/03/2023 0933   GLUCOSE 55 (L) 02/03/2023 0933   BUN 14 02/03/2023 0933   BUN 15 07/10/2020 0000   CREATININE 0.74 02/03/2023 0933   CALCIUM 9.7 02/03/2023 0933   PROT 6.5 02/03/2023 0933   ALBUMIN 4.4 02/03/2023 0933   AST 14 02/03/2023 0933   ALT 12 02/03/2023 0933   ALKPHOS 56 02/03/2023 0933   BILITOT 0.5 02/03/2023 0933   GFRNONAA 92.23 01/17/2020 0000   GFRAA  03/12/2010 0900    >60        The eGFR has been calculated using the MDRD equation. This  calculation has not been validated in all clinical situations. eGFR's persistently <60 mL/min signify possible Chronic Kidney Disease.      Latest  Ref Rng & Units 02/03/2023    9:33 AM 07/15/2022    9:47 AM 03/26/2021    3:01 PM  BMP  Glucose 70 - 99 mg/dL 55  66  308   BUN 6 - 23 mg/dL 14  12  18    Creatinine 0.40 - 1.20 mg/dL 6.57  8.46  9.62   Sodium 135 - 145 mEq/L 141  139  136   Potassium 3.5 - 5.1 mEq/L 4.0  4.0  4.0   Chloride 96 - 112 mEq/L 101  102  99   CO2 19 - 32 mEq/L 32  32  30   Calcium 8.4 - 10.5 mg/dL 9.7  9.2  8.8        Component Value Date/Time   WBC 5.0 07/15/2022 0947   RBC 4.62 07/15/2022 0947   HGB 13.7 07/15/2022 0947   HCT 40.7 07/15/2022 0947   PLT 254.0 07/15/2022 0947   MCV 88.1 07/15/2022 0947   MCHC 33.7 07/15/2022 0947   RDW 13.2 07/15/2022 0947   LYMPHSABS 1.8 04/02/2011 1613   MONOABS 0.5 04/02/2011 1613   EOSABS 0.1 04/02/2011 1613   BASOSABS 0.0 04/02/2011 1613     Parts of this note may have been dictated using voice recognition software. There may be variances in spelling and vocabulary which are unintentional. Not all errors are proofread. Please notify the Thereasa Parkin if any discrepancies are noted or if the meaning of any statement is not clear.

## 2023-06-03 NOTE — Patient Instructions (Addendum)
Toujeo 18 units qam  <70 no humalog If need humalog, has to be 15 min before meals

## 2023-06-04 ENCOUNTER — Encounter: Payer: Self-pay | Admitting: "Endocrinology

## 2023-06-20 ENCOUNTER — Other Ambulatory Visit: Payer: Self-pay | Admitting: "Endocrinology

## 2023-06-20 DIAGNOSIS — E109 Type 1 diabetes mellitus without complications: Secondary | ICD-10-CM

## 2023-07-26 ENCOUNTER — Other Ambulatory Visit: Payer: Self-pay | Admitting: Family Medicine

## 2023-07-26 DIAGNOSIS — E109 Type 1 diabetes mellitus without complications: Secondary | ICD-10-CM

## 2023-09-28 ENCOUNTER — Ambulatory Visit: Payer: 59 | Admitting: "Endocrinology

## 2023-10-13 ENCOUNTER — Telehealth: Payer: Self-pay

## 2023-10-13 NOTE — Telephone Encounter (Signed)
 Pt need PA done for G7

## 2023-10-14 ENCOUNTER — Telehealth: Payer: Self-pay

## 2023-10-14 ENCOUNTER — Other Ambulatory Visit (HOSPITAL_COMMUNITY): Payer: Self-pay

## 2023-10-14 NOTE — Telephone Encounter (Signed)
 Pharmacy Patient Advocate Encounter   Received notification from Pt Calls Messages that prior authorization for Dexcom G7 sensor is required/requested.   Insurance verification completed.   The patient is insured through [facility] .   Per test claim: PA required; PA submitted to above mentioned insurance via CoverMyMeds Key/confirmation #/EOC [id number] Status is pending

## 2023-10-18 ENCOUNTER — Other Ambulatory Visit: Payer: Self-pay | Admitting: "Endocrinology

## 2023-10-18 NOTE — Patient Instructions (Incomplete)
 It was good to see you again today - I think all is well and this is just a minor skin finding.  Use your cortisone cream for a few more days as needed.  However if this does not clear up or if you are still concerned just let me know- I can set up a diagnostic mammogram and ultrasound if needed!

## 2023-10-18 NOTE — Progress Notes (Unsigned)
 [facility] [address], [address] [place], [place] [address] [phone]  Date:  10/20/2023   Name:  Maria White   DOB:  29-Jan-1966   MRN:  [id number]  PCP:  Pearline Cables, MD    Chief Complaint: Rash (She saya this is the second occurrence and wants to see if this needs a Mammogram or a cream/ointment. )   History of Present Illness:  Maria White is a 58 y.o. very pleasant female patient who presents with the following:  Pt seen today with concern of a rash on her breast- History of type 1 diabetes, pituitary adenoma -  Last seen by myself 2/24 She sees Dr Talmage Nap for her DM management   Can update foot exam Eye exam - she will go coming up this summer Shingrix- she is thinking about doing this but is traveling out of town today  Flu shot Mammo UTD- done in June, diagnostic  Colon UTD  She had had this rash twice now-first occurred maybe a year or so ago Went away, came back- in a different spot on the left breast about 5 days ago She notes a small salmon-colored patch on the medial aspect of the left breast.  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 edema (HCC) 12/24/2015   Diabetes mellitus (HCC) 12/29/2011   Tennis elbow 02/03/2011   Fibrocystic breast 02/03/2011   DM w/o complication type I (HCC) 01/26/2011    Past Medical History:  Diagnosis Date   Anemia    resolved   Diabetes mellitus    age 43   Elevated prolactin level    Pituitary adenoma (HCC)  9/11   5 mm    Past Surgical History:  Procedure Laterality Date   COLPOSCOPY     ENDOMETRIAL ABLATION  8/11   her option   LASER ABLATION OF THE CERVIX      Social History   Tobacco Use   Smoking status: Never   Smokeless tobacco: Never  Vaping Use   Vaping status: Never Used  Substance Use Topics   Alcohol use: No   Drug use: No    Family History  Problem Relation Age of Onset   Breast cancer Maternal Grandmother 46       and great grandmother   Hypertension Mother    Breast cancer Mother 18   Breast cancer Other    Colon cancer Neg Hx    Esophageal cancer Neg Hx    Rectal cancer Neg Hx    Stomach cancer Neg Hx     No Known Allergies  Medication list has been reviewed and updated.  Current Outpatient Medications on File Prior to Visit  Medication Sig Dispense Refill   Continuous Glucose Sensor (DEXCOM G7 SENSOR) MISC 1 Device by Does not apply route continuous. 9 each 1   Continuous Glucose Sensor (DEXCOM G7 SENSOR) MISC 1 Device by Does not apply route continuous. 9 each 2   Glucagon (GVOKE HYPOPEN 1-PACK) 1  MG/0.2ML SOAJ Inject 1 mg into the skin as needed (low blood sugar with impaired consciousness). 0.4 mL 2   glucose blood (ONE TOUCH ULTRA TEST) test strip Use as instructed test 8 to 10 times a day 700 each 3   glucose blood (ONETOUCH ULTRA) test strip Use as instructed 150 each 3   insulin lispro (HUMALOG) 100 UNIT/ML injection INJECT SUBCUTANEOUSLY UP TO 50  UNITS DAILY BASED ON  INSULIN-CARB RATIO 50 mL 3   Insulin Pen Needle (BD ULTRA-FINE PEN NEEDLES) 29G X 12.7MM MISC USE 1 PEN NEEDLE IN THE MORNING  AND AT BEDTIME 180 each 3   Insulin Syringe-Needle U-100 (B-D INS SYR ULTRAFINE .5CC/30G) 30G X 1/2" 0.5 ML MISC Use as directed to administer insulin 100 each 3   Insulin Syringe-Needle U-100 (BD INSULIN SYRINGE U/F) 30G X 1/2" 0.3 ML MISC USE AS DIRECTED SUBCUTANEOUSLY 5 TIMES DAILY 450 each 1   Multiple Vitamins-Minerals (MULTIVITAMIN ADULT PO) Take 1  tablet by mouth daily.     TOUJEO SOLOSTAR 300 UNIT/ML Solostar Pen Inject 18 Units into the skin once for 1 dose. 18 mL 3   No current facility-administered medications on file prior to visit.    Review of Systems:  As per HPI- otherwise negative.   Physical Examination: Vitals:   10/20/23 1306  BP: 110/68  Pulse: 70  Resp: 18  Temp: 97.6 F (36.4 C)  SpO2: 99%   Vitals:   10/20/23 1306  Weight: 123 lb 9.6 oz (56.1 kg)  Height: 5\' 4"  (1.626 m)   Body mass index is 21.22 kg/m. Ideal Body Weight: Weight in (lb) to have BMI = 25: 145.3  GEN: no acute distress.  Petite build, looks well HEENT: Atraumatic, Normocephalic.  Ears and Nose: No external deformity. CV: RRR, No M/G/R. No JVD. No thrill. No extra heart sounds. PULM: CTA B, no wheezes, crackles, rhonchi. No retractions. No resp. distress. No accessory muscle use. ABD: S, NT, ND EXTR: No c/c/e PSYCH: Normally interactive. Conversant.  Foot exam today- normal  Bilateral breast and axillary exam normal except there is an approximately dime sized salmon-colored flat macule at 8:00 on the left breast.  It appears benign  Assessment and Plan: Breast skin changes  Patient seen today with concern about a skin change on her left breast.  On exam this appears to be a fairly benign finding such as it small patch of eczema.  She notes it is improving with use of topical steroid cream.  We discussed getting a diagnostic mammogram and ultrasound of the left side, advised patient I am glad to arrange this for her.  For the time being she wants to continue her steroid cream and see how things go over the next week or so If her skin does not clear up she will contact me and I can arrange imaging Also have her see dermatologist if needed  Signed Abbe Amsterdam, MD

## 2023-10-20 ENCOUNTER — Ambulatory Visit: Admitting: Family Medicine

## 2023-10-20 VITALS — BP 110/68 | HR 70 | Temp 97.6°F | Resp 18 | Ht 64.0 in | Wt 123.6 lb

## 2023-10-20 DIAGNOSIS — R234 Changes in skin texture: Secondary | ICD-10-CM

## 2023-10-22 NOTE — Telephone Encounter (Signed)
 Pharmacy Patient Advocate Encounter  Received notification from [facility] that Prior Authorization for Dexcom G7 sensor has been APPROVED through 10/13/24   PA #/Case ID/Reference #: [id number]

## 2023-11-03 ENCOUNTER — Encounter: Payer: Self-pay | Admitting: "Endocrinology

## 2023-11-03 ENCOUNTER — Ambulatory Visit: Payer: 59 | Admitting: "Endocrinology

## 2023-11-03 VITALS — BP 118/80 | HR 80 | Ht 64.0 in | Wt 126.0 lb

## 2023-11-03 DIAGNOSIS — E7800 Pure hypercholesterolemia, unspecified: Secondary | ICD-10-CM

## 2023-11-03 DIAGNOSIS — E10649 Type 1 diabetes mellitus with hypoglycemia without coma: Secondary | ICD-10-CM | POA: Diagnosis not present

## 2023-11-03 LAB — POCT GLYCOSYLATED HEMOGLOBIN (HGB A1C): Hemoglobin A1C: 5.8 % — AB (ref 4.0–5.6)

## 2023-11-03 MED ORDER — DEXCOM G7 SENSOR MISC: 1.0000 | 3 refills | Status: DC

## 2023-11-03 NOTE — Progress Notes (Signed)
 Outpatient Endocrinology Note Altamese Bensley, MD  11/03/23   Maria White Dec 08, 1965 [id number]  Referring Provider: Pearline Cables, MD Primary Care Provider: Pearline Cables, MD Reason for consultation: Subjective   Assessment & Plan  Diagnoses and all orders for this visit:  Type 1 diabetes mellitus with hypoglycemia and without coma (HCC) -     POCT glycosylated hemoglobin (Hb A1C)  Other orders -     Continuous Glucose Sensor (DEXCOM G7 SENSOR) MISC; 1 Device by Does not apply route continuous.   Diabetes Type I with no complications Lab Results  Component Value Date   GFR 89.67 02/03/2023   Hba1c goal less than 7, current Hba1c is  Lab Results  Component Value Date   HGBA1C 5.8 (A) 11/03/2023   Will recommend the following: Toujeo 16-17 units qam  Humalog IC 1:15, ISF 1:50>150, 15 min before meals (take one unit less than calculated dose if still having hypoglycemia) BG <70 = no humalog  No known contraindications to any of above medications Glucagon explained and prescribed with refills on 02/10/23  -Last LD and Tg are as follows: Lab Results  Component Value Date   LDLCALC 128 (H) 02/03/2023    Lab Results  Component Value Date   TRIG 55.0 02/03/2023   -Chooses not to be on statin, counseled extensively previously  -Follow low fat diet and exercise   -Blood pressure goal <140/90 - Microalbumin/creatinine goal < 30 -Last MA/Cr is as follows: Lab Results  Component Value Date   MICROALBUR <0.7 02/03/2023   -on ACE/ARB  -diet changes including salt restriction -limit eating outside -counseled BP targets per standards of diabetes care -uncontrolled blood pressure can lead to retinopathy, nephropathy and cardiovascular and atherosclerotic heart disease  Reviewed and counseled on: -A1C target -Blood sugar targets -Complications of uncontrolled diabetes  -Checking blood sugar before meals and bedtime and bring log next visit -All  medications with mechanism of action and side effects -Hypoglycemia management: rule of 15's, Glucagon Emergency Kit and medical alert ID -low-carb low-fat plate-method diet -At least 20 minutes of physical activity per day -Annual dilated retinal eye exam and foot exam -compliance and follow up needs -follow up as scheduled or earlier if problem gets worse  Call if blood sugar is less than 70 or consistently above 250    Take a 15 gm snack of carbohydrate at bedtime before you go to sleep if your blood sugar is less than 100.    If you are going to fast after midnight for a test or procedure, ask your physician for instructions on how to reduce/decrease your insulin dose.    Call if blood sugar is less than 70 or consistently above 250  -Treating a low sugar by rule of 15  (15 gms of sugar every 15 min until sugar is more than 70) If you feel your sugar is low, test your sugar to be sure If your sugar is low (less than 70), then take 15 grams of a fast acting Carbohydrate (3-4 glucose tablets or glucose gel or 4 ounces of juice or regular soda) Recheck your sugar 15 min after treating low to make sure it is more than 70 If sugar is still less than 70, treat again with 15 grams of carbohydrate          Don't drive the hour of hypoglycemia  If unconscious/unable to eat or drink by mouth, use glucagon injection or nasal spray baqsimi and call 911. Can repeat  again in 15 min if still unconscious.  Return in about 3 months (around 02/02/2024).   I have reviewed current medications, nurse's notes, allergies, vital signs, past medical and surgical history, family medical history, and social history for this encounter. Counseled patient on symptoms, examination findings, lab findings, imaging results, treatment decisions and monitoring and prognosis. The patient understood the recommendations and agrees with the treatment plan. All questions regarding treatment plan were fully answered.  Altamese Black, MD  11/03/23  History of Present Illness Maria White is a 58 y.o. year old female who presents for follow up on of Type I diabetes mellitus.  Maria White was first diagnosed at 35. Diabetes education +  Home diabetes regimen: Toujeo 18 units qam Humalog IC 1:15, ISF 1:50>150, 15 min before meals   COMPLICATIONS -  MI/Stroke -  retinopathy -  neuropathy -  nephropathy  BLOOD SUGAR DATA  CGM interpretation: At today's visit, we reviewed her CGM downloads. The full report is scanned in the media. Reviewing the CGM trends, BG are well controlled most of the day with frequent lows in daytime and overnight per pt.   Physical Exam  BP 118/80   Pulse 80   Ht 5\' 4"  (1.626 m)   Wt 126 lb (57.2 kg)   SpO2 97%   BMI 21.63 kg/m    Constitutional: well developed, well nourished Head: normocephalic, atraumatic Eyes: sclera anicteric, no redness Neck: supple Lungs: normal respiratory effort Neurology: alert and oriented Skin: dry, no appreciable rashes Musculoskeletal: no appreciable defects Psychiatric: normal mood and affect Diabetic Foot Exam - Simple   Simple Foot Form Diabetic Foot exam was performed with the following findings: Yes   Visual Inspection Sensation Testing Pulse Check Comments      Current Medications Patient's Medications  New Prescriptions   CONTINUOUS GLUCOSE SENSOR (DEXCOM G7 SENSOR) MISC    1 Device by Does not apply route continuous.  Previous Medications   CONTINUOUS GLUCOSE SENSOR (DEXCOM G7 SENSOR) MISC    1 Device by Does not apply route continuous.   CONTINUOUS GLUCOSE SENSOR (DEXCOM G7 SENSOR) MISC    1 Device by Does not apply route continuous.   GLUCAGON (GVOKE HYPOPEN 1-PACK) 1 MG/0.2ML SOAJ    Inject 1 mg into the skin as needed (low blood sugar with impaired consciousness).   GLUCOSE BLOOD (ONE TOUCH ULTRA TEST) TEST STRIP    Use as instructed test 8 to 10 times a day   GLUCOSE BLOOD (ONETOUCH ULTRA) TEST STRIP    Use as  instructed   INSULIN LISPRO (HUMALOG) 100 UNIT/ML INJECTION    INJECT SUBCUTANEOUSLY UP TO 50  UNITS DAILY BASED ON  INSULIN-CARB RATIO   INSULIN PEN NEEDLE (BD ULTRA-FINE PEN NEEDLES) 29G X 12.7MM MISC    USE 1 PEN NEEDLE IN THE MORNING  AND AT BEDTIME   INSULIN SYRINGE-NEEDLE U-100 (B-D INS SYR ULTRAFINE .5CC/30G) 30G X 1/2" 0.5 ML MISC    Use as directed to administer insulin   INSULIN SYRINGE-NEEDLE U-100 (BD INSULIN SYRINGE U/F) 30G X 1/2" 0.3 ML MISC    USE AS DIRECTED SUBCUTANEOUSLY 5 TIMES DAILY   MULTIPLE VITAMINS-MINERALS (MULTIVITAMIN ADULT PO)    Take 1 tablet by mouth daily.   TOUJEO SOLOSTAR 300 UNIT/ML SOLOSTAR PEN    Inject 18 Units into the skin once for 1 dose.  Modified Medications   No medications on file  Discontinued Medications   No medications on file    Allergies No  Known Allergies  Past Medical History Past Medical History:  Diagnosis Date   Anemia    resolved   Diabetes mellitus    age 70   Elevated prolactin level    Pituitary adenoma (HCC) 9/11   5 mm    Past Surgical History Past Surgical History:  Procedure Laterality Date   COLPOSCOPY     ENDOMETRIAL ABLATION  8/11   her option   LASER ABLATION OF THE CERVIX      Family History family history includes Breast cancer in an other family member; Breast cancer (age of onset: 61) in her maternal grandmother; Breast cancer (age of onset: 79) in her mother; Hypertension in her mother.  Social History Social History   Socioeconomic History   Marital status: Married    Spouse name: Not on file   Number of children: Not on file   Years of education: Not on file   Highest education level: Not on file  Occupational History   Not on file  Tobacco Use   Smoking status: Never   Smokeless tobacco: Never  Vaping Use   Vaping status: Never Used  Substance and Sexual Activity   Alcohol use: No   Drug use: No   Sexual activity: Yes    Birth control/protection: Other-see comments    Comment:  Vasectomy  Other Topics Concern   Not on file  Social History Narrative   Not on file   Social Drivers of Health   Financial Resource Strain: Not on file  Food Insecurity: Not on file  Transportation Needs: Not on file  Physical Activity: Not on file  Stress: Not on file  Social Connections: Unknown (12/09/2021)   Received from [facility]   Social Network    Social Network: Not on file  Intimate Partner Violence: Unknown (10/31/2021)   Received from [facility], [facility]   HITS    Physically Hurt: Not on file    Insult or Talk Down To: Not on file    Threaten Physical Harm: Not on file    Scream or Curse: Not on file    Lab Results  Component Value Date   HGBA1C 5.8 (A) 11/03/2023   HGBA1C 5.5 06/03/2023   HGBA1C 6.2 02/03/2023   Lab Results  Component Value Date   CHOL 214 (H) 02/03/2023   Lab Results  Component Value Date   HDL 74.70 02/03/2023   Lab Results  Component Value Date   LDLCALC 128 (H) 02/03/2023   Lab Results  Component Value Date   TRIG 55.0 02/03/2023   Lab Results  Component Value Date   CHOLHDL 3 02/03/2023   Lab Results  Component Value Date   CREATININE 0.74 02/03/2023   Lab Results  Component Value Date   GFR 89.67 02/03/2023   Lab Results  Component Value Date   MICROALBUR <0.7 02/03/2023      Component Value Date/Time   NA 141 02/03/2023 0933   NA 144 07/10/2020 0000   K 4.0 02/03/2023 0933   CL 101 02/03/2023 0933   CO2 32 02/03/2023 0933   GLUCOSE 55 (L) 02/03/2023 0933   BUN 14 02/03/2023 0933   BUN 15 07/10/2020 0000   CREATININE 0.74 02/03/2023 0933   CALCIUM 9.7 02/03/2023 0933   PROT 6.5 02/03/2023 0933   ALBUMIN 4.4 02/03/2023 0933   AST 14 02/03/2023 0933   ALT 12 02/03/2023 0933   ALKPHOS 56 02/03/2023 0933   BILITOT 0.5 02/03/2023 0933  GFRNONAA 92.23 01/17/2020 0000   GFRAA  03/12/2010 0900    >60        The eGFR has been calculated using the MDRD equation. This  calculation has not been validated in all clinical situations. eGFR's persistently <60 mL/min signify possible Chronic Kidney Disease.      Latest Ref Rng & Units 02/03/2023    9:33 AM 07/15/2022    9:47 AM 03/26/2021    3:01 PM  BMP  Glucose 70 - 99 mg/dL 55  66  161   BUN 6 - 23 mg/dL 14  12  18    Creatinine 0.40 - 1.20 mg/dL 0.96  0.45  4.09   Sodium 135 - 145 mEq/L 141  139  136   Potassium 3.5 - 5.1 mEq/L 4.0  4.0  4.0   Chloride 96 - 112 mEq/L 101  102  99   CO2 19 - 32 mEq/L 32  32  30   Calcium 8.4 - 10.5 mg/dL 9.7  9.2  8.8        Component Value Date/Time   WBC 5.0 07/15/2022 0947   RBC 4.62 07/15/2022 0947   HGB 13.7 07/15/2022 0947   HCT 40.7 07/15/2022 0947   PLT 254.0 07/15/2022 0947   MCV 88.1 07/15/2022 0947   MCHC 33.7 07/15/2022 0947   RDW 13.2 07/15/2022 0947   LYMPHSABS 1.8 04/02/2011 1613   MONOABS 0.5 04/02/2011 1613   EOSABS 0.1 04/02/2011 1613   BASOSABS 0.0 04/02/2011 1613     Parts of this note may have been dictated using voice recognition software. There may be variances in spelling and vocabulary which are unintentional. Not all errors are proofread. Please notify the Thereasa Parkin if any discrepancies are noted or if the meaning of any statement is not clear.

## 2023-11-03 NOTE — Patient Instructions (Signed)

## 2023-11-17 ENCOUNTER — Encounter: Payer: Self-pay | Admitting: "Endocrinology

## 2024-01-20 ENCOUNTER — Encounter: Payer: Self-pay | Admitting: Obstetrics and Gynecology

## 2024-01-20 ENCOUNTER — Ambulatory Visit: Admitting: Obstetrics and Gynecology

## 2024-01-20 ENCOUNTER — Other Ambulatory Visit (HOSPITAL_COMMUNITY)
Admission: RE | Admit: 2024-01-20 | Discharge: 2024-01-20 | Disposition: A | Discharge status: Home / Self Care | Source: Ambulatory Visit | Attending: Obstetrics and Gynecology | Admitting: Obstetrics and Gynecology

## 2024-01-20 VITALS — BP 108/68 | HR 77 | Ht 63.75 in | Wt 125.0 lb

## 2024-01-20 DIAGNOSIS — Z1231 Encounter for screening mammogram for malignant neoplasm of breast: Secondary | ICD-10-CM

## 2024-01-20 DIAGNOSIS — Z01419 Encounter for gynecological examination (general) (routine) without abnormal findings: Secondary | ICD-10-CM | POA: Diagnosis present

## 2024-01-20 DIAGNOSIS — N951 Menopausal and female climacteric states: Secondary | ICD-10-CM | POA: Diagnosis not present

## 2024-01-20 DIAGNOSIS — E2839 Other primary ovarian failure: Secondary | ICD-10-CM

## 2024-01-20 DIAGNOSIS — Z8742 Personal history of other diseases of the female genital tract: Secondary | ICD-10-CM

## 2024-01-20 MED ORDER — PROGESTERONE MICRONIZED 100 MG PO CAPS: 100.0000 mg | ORAL_CAPSULE | Freq: Every day | ORAL | 3 refills | Status: AC

## 2024-01-20 MED ORDER — INTRAROSA 6.5 MG VA INST: 1.0000 | VAGINAL_INSERT | Freq: Every evening | VAGINAL | 12 refills | Status: DC | PRN

## 2024-01-20 NOTE — Progress Notes (Signed)
 58 y.o. y.o. female here for annual exam. No LMP recorded. Patient has had an ablation.    [id number] Married White or Caucasian Not Hispanic or Latino female here for annual exam.  H/O endometrial ablation.  Having hot flashes, insomnia, hair loss and dyspareunia.  Stopped periods Strong family history of breast cancer does not want estrogen To try intrarosa and prometrium at [facility]  Patient states her mom went through menopause in her late 51's.    History of an elevated prolactin and a microadenoma. Needs yearly prolactin levels, last prolactin was in 12/23 and was 13.7. Stable brain MRI in 5/20.   Diabetes is well controlled, recent HgbA1C was 6.2  No bowel or bladder issues.   No LMP recorded. Patient has had an ablation.          Sexually active: Yes.    The current method of family planning is vasectomy.    Exercising: Yes.    Walking and light weights  Smoker:  no  Health Maintenance: Pap:  11/12/22 History of abnormal Pap:  yes colpo 30 years ago, she did have surgery on her cervix.   MMG:  12/24/21 Density C Bi-rads 1 neg  BMD:   nbaseline ordered. No fractures Colonoscopy: 01/10/20 F/u every 5 years  TDaP:  unsure  Gardasil: none  Body mass index is 21.62 kg/m.     09/23/2022    1:22 PM 07/15/2022    9:22 AM 02/19/2021    1:58 PM  Depression screen PHQ 2/9  Decreased Interest 0 0 0  Down, Depressed, Hopeless 0 0 0  PHQ - 2 Score 0 0 0    Height 5' 3.75 (1.619 m), weight 125 lb (56.7 kg).     Component Value Date/Time   DIAGPAP  11/12/2022 0904    - Negative for intraepithelial lesion or malignancy (NILM)   HPVHIGH Negative 11/12/2022 0904   ADEQPAP  11/12/2022 0904    Satisfactory for evaluation; transformation zone component PRESENT.    GYN HISTORY:    Component Value Date/Time   DIAGPAP  11/12/2022 0904    - Negative for intraepithelial lesion or malignancy (NILM)   HPVHIGH Negative 11/12/2022 0904   ADEQPAP  11/12/2022 0904    Satisfactory for  evaluation; transformation zone component PRESENT.    OB History  Gravida Para Term Preterm AB Living  3 2 2  1 2   SAB IAB Ectopic Multiple Live Births  1        # Outcome Date GA Lbr Len/2nd Weight Sex Type Anes PTL Lv  3 SAB           2 Term           1 Term             Past Medical History:  Diagnosis Date   Anemia    resolved   Diabetes mellitus    age 35   Elevated prolactin level    Pituitary adenoma (HCC) 9/11   5 mm    Past Surgical History:  Procedure Laterality Date   COLPOSCOPY     ENDOMETRIAL ABLATION  8/11   her option   LASER ABLATION OF THE CERVIX      Current Outpatient Medications on File Prior to Visit  Medication Sig Dispense Refill   Continuous Glucose Sensor (DEXCOM G7 SENSOR) MISC 1 Device by Does not apply route continuous. 9 each 1   Glucagon  (GVOKE HYPOPEN  1-PACK) 1 MG/0.2ML SOAJ Inject 1 mg into the  skin as needed (low blood sugar with impaired consciousness). 0.4 mL 2   glucose blood (ONE TOUCH ULTRA TEST) test strip Use as instructed test 8 to 10 times a day 700 each 3   insulin  lispro (HUMALOG ) 100 UNIT/ML injection INJECT SUBCUTANEOUSLY UP TO 50  UNITS DAILY BASED ON  INSULIN -CARB RATIO 50 mL 3   Insulin  Pen Needle (BD ULTRA-FINE PEN NEEDLES) 29G X 12.7MM MISC USE 1 PEN NEEDLE IN THE MORNING  AND AT BEDTIME 180 each 3   Insulin  Syringe-Needle U-100 (B-D INS SYR ULTRAFINE .5CC/30G) 30G X 1/2 0.5 ML MISC Use as directed to administer insulin  100 each 3   Insulin  Syringe-Needle U-100 (BD INSULIN  SYRINGE U/F) 30G X 1/2 0.3 ML MISC USE AS DIRECTED SUBCUTANEOUSLY 5 TIMES DAILY 450 each 1   Multiple Vitamins-Minerals (MULTIVITAMIN ADULT PO) Take 1 tablet by mouth daily.     glucose blood (ONETOUCH ULTRA) test strip Use as instructed (Patient not taking: Reported on 01/20/2024) 150 each 3   TOUJEO  SOLOSTAR 300 UNIT/ML Solostar Pen Inject 18 Units into the skin once for 1 dose. 18 mL 3   No current facility-administered medications on file prior to  visit.    Social History   Socioeconomic History   Marital status: Married    Spouse name: Not on file   Number of children: Not on file   Years of education: Not on file   Highest education level: Not on file  Occupational History   Not on file  Tobacco Use   Smoking status: Never   Smokeless tobacco: Never  Vaping Use   Vaping status: Never Used  Substance and Sexual Activity   Alcohol use: No   Drug use: No   Sexual activity: Yes    Partners: Male    Birth control/protection: Other-see comments    Comment: Vasectomy  Other Topics Concern   Not on file  Social History Narrative   Not on file   Social Drivers of Health   Financial Resource Strain: Not on file  Food Insecurity: Not on file  Transportation Needs: Not on file  Physical Activity: Not on file  Stress: Not on file  Social Connections: Unknown (12/09/2021)   Received [facility]   Social Network    Social Network: Not on file  Intimate Partner Violence: Unknown (10/31/2021)   Received from [facility]   HITS    Physically Hurt: Not on file    Insult or Talk Down To: Not on file    Threaten Physical Harm: Not on file    Scream or Curse: Not on file    Family History  Problem Relation Age of Onset   Breast cancer Maternal Grandmother 38       and great grandmother   Hypertension Mother    Breast cancer Mother 31   Breast cancer Other    Colon cancer Neg Hx    Esophageal cancer Neg Hx    Rectal cancer Neg Hx    Stomach cancer Neg Hx      No Known Allergies    Patient's last menstrual period was No LMP recorded. Patient has had an ablation..             Review of Systems Alls systems reviewed and are negative.     Physical Exam Constitutional:      Appearance: Normal appearance.  Genitourinary:     Vulva and urethral meatus normal.     No lesions in the vagina.  Right Labia: No rash, lesions or skin changes.    Left Labia: No lesions, skin changes or rash.    No  vaginal discharge or tenderness.     No vaginal prolapse present.    No vaginal atrophy present.     Right Adnexa: not tender, not palpable and no mass present.    Left Adnexa: not tender, not palpable and no mass present.    No cervical motion tenderness or discharge.     Uterus is not enlarged, tender or irregular.  Breasts:    Right: Normal.     Left: Normal.  HENT:     Head: Normocephalic.  Neck:     Thyroid : No thyroid  mass, thyromegaly or thyroid  tenderness.   Cardiovascular:     Rate and Rhythm: Normal rate and regular rhythm.     Heart sounds: Normal heart sounds, S1 normal and S2 normal.  Pulmonary:     Effort: Pulmonary effort is normal.     Breath sounds: Normal breath sounds and air entry.  Abdominal:     General: There is no distension.     Palpations: Abdomen is soft. There is no mass.     Tenderness: There is no abdominal tenderness. There is no guarding or rebound.   Musculoskeletal:        General: Normal range of motion.     Cervical back: Full passive range of motion without pain, normal range of motion and neck supple. No tenderness.     Right lower leg: No edema.     Left lower leg: No edema.   Neurological:     Mental Status: She is alert.   Skin:    General: Skin is warm.   Psychiatric:        Mood and Affect: Mood normal.        Behavior: Behavior normal.        Thought Content: Thought content normal.  Vitals and nursing note reviewed. Exam conducted with a chaperone present.       A:         Well Woman GYN exam                             P:        Pap smear collected today Encouraged annual mammogram screening Colon cancer screening up-to-date DXA ordered today Labs and immunizations to do with PMD Discussed breast self exams Encouraged healthy lifestyle practices Encouraged Vit D and Calcium  Dyspareunia and vaginal dryness: would like a natural plant based source of vaginal estrogen. Has not tried vaginal estrogen.  Discussed  possible cost. To try intrarosa and prometrium at bedtime and b complex for hair support  No follow-ups on file.  Maria White

## 2024-01-23 ENCOUNTER — Ambulatory Visit: Payer: Self-pay | Admitting: Obstetrics and Gynecology

## 2024-01-23 DIAGNOSIS — E280 Estrogen excess: Secondary | ICD-10-CM

## 2024-01-25 LAB — VITAMIN D 25 HYDROXY (VIT D DEFICIENCY, FRACTURES): Vit D, 25-Hydroxy: 40 ng/mL (ref 30–100)

## 2024-01-25 LAB — CYTOLOGY - PAP: Diagnosis: NEGATIVE

## 2024-01-25 LAB — TSH: TSH: 1.65 m[IU]/L (ref 0.40–4.50)

## 2024-01-25 LAB — FOLLICLE STIMULATING HORMONE: FSH: 26.5 m[IU]/mL

## 2024-01-25 LAB — PROLACTIN: Prolactin: 17.2 ng/mL

## 2024-01-25 LAB — TESTOS,TOTAL,FREE AND SHBG (FEMALE)
Free Testosterone: 2.6 pg/mL (ref 0.1–6.4)
Sex Hormone Binding: 150 nmol/L — ABNORMAL HIGH (ref 14–73)
Testosterone, Total, LC-MS-MS: 53 ng/dL — ABNORMAL HIGH (ref 2–45)

## 2024-01-25 LAB — ESTROGENS, TOTAL: Estrogen: 615 pg/mL

## 2024-01-25 LAB — HEMOGLOBIN A1C
Hgb A1c MFr Bld: 6 % — ABNORMAL HIGH (ref <5.7)
Mean Plasma Glucose: 126 mg/dL
eAG (mmol/L): 7 mmol/L

## 2024-02-02 MED ORDER — INTRAROSA 6.5 MG VA INST: 1.0000 | VAGINAL_INSERT | Freq: Every evening | VAGINAL | 12 refills | Status: DC | PRN

## 2024-02-03 ENCOUNTER — Ambulatory Visit: Admitting: Obstetrics and Gynecology

## 2024-02-03 ENCOUNTER — Ambulatory Visit

## 2024-02-03 ENCOUNTER — Encounter: Payer: Self-pay | Admitting: Obstetrics and Gynecology

## 2024-02-03 ENCOUNTER — Telehealth: Payer: Self-pay

## 2024-02-03 VITALS — BP 118/76 | HR 71

## 2024-02-03 DIAGNOSIS — E280 Estrogen excess: Secondary | ICD-10-CM | POA: Diagnosis not present

## 2024-02-03 DIAGNOSIS — N950 Postmenopausal bleeding: Secondary | ICD-10-CM

## 2024-02-03 LAB — HCG, QUANTITATIVE, PREGNANCY: HCG, Total, QN: <5 m[IU]/mL

## 2024-02-03 LAB — ESTRADIOL: Estradiol: 270 pg/mL

## 2024-02-03 LAB — FOLLICLE STIMULATING HORMONE: FSH: 7.1 m[IU]/mL

## 2024-02-03 MED ORDER — INTRAROSA 6.5 MG VA INST: 1.0000 | VAGINAL_INSERT | Freq: Every evening | VAGINAL | 12 refills | Status: AC | PRN

## 2024-02-03 NOTE — Progress Notes (Signed)
   Acute Office Visit  Subjective:    Patient ID: Maria White, female    DOB: 1966/04/26, 58 y.o.   MRN: [id number]   HPI 58 y.o. presents today for u/s & consult (U/s & consult//jj) .  No LMP recorded. Patient has had an ablation.  Has noted M-w light vaginal spotting.  She has not started any hormones and would like to do PA for intrarosa  from [facility]  Patient has not tried any other estrogen cream, pills, rings etc PV for dyspareunia and vaginal dryness.  She would like to try this instead of any estrogen products with her mother's history of breast cancer.  Estrogen increased on last set of labs and to repeat labs with serum quant today  PUS 9.42cm with fibroids (1.04, 1.61cm) Endometrial lining 4.73mm Ablation appearance  RO atrophic in size LO 25x40mm simple avascular cyst No adnexal masses  Review of Systems     Objective:    OBGyn Exam  BP 118/76   Pulse 71   PF 97 L/min  Wt Readings from Last 3 Encounters:  01/20/24 125 lb (56.7 kg)  11/03/23 126 lb (57.2 kg)  10/20/23 123 lb 9.6 oz (56.1 kg)   Past Medical History:  Diagnosis Date   Anemia    resolved   Diabetes mellitus    age 58   Elevated prolactin level    Pituitary adenoma (HCC) 9/11   5 mm   Past Surgical History:  Procedure Laterality Date   COLPOSCOPY     ENDOMETRIAL ABLATION  8/11   her option   LASER ABLATION OF THE CERVIX          Patient informed chaperone available to be present for breast and/or pelvic exam. Patient has requested no chaperone to be present. Patient has been advised what will be completed during breast and pelvic exam.   Assessment & Plan:  PM Spotting: lining in normal range.  Has not started any prometrium . Will start this to help control spotting and protect endometrial lining. PA for intrarosa : use for dyspareunia, declines estrogen products and has not tried others.  Will send PA when request comes. Prescription sent to [facility] 30 minutes spent on  reviewing records, imaging,  and one on one patient time and counseling patient and documentation Dr. Glennon Almarie MARLA Glennon

## 2024-02-03 NOTE — Telephone Encounter (Signed)
 Prior authorization request received from covermymeds for Intrarosa .  PA initiated.  Patient notified. KEY: [id number]; [id number]

## 2024-02-04 ENCOUNTER — Ambulatory Visit: Payer: Self-pay | Admitting: Obstetrics and Gynecology

## 2024-02-08 NOTE — Telephone Encounter (Signed)
 Prior authorization for Intrarosa  was denied. The request for coverage for INTRAROSA  SUP 6.5MG , use as directed (30 suppositories per month), is denied. This decision is based on health plan criteria for INTRAROSA  SUP 6.5MG . This medicine is covered only if: You have failed or cannot use two of the following: (A) Imvexxy  (estradiol ). (B) Osphena (ospemifene). (C) Premarin  vaginal cream.  Sent to provider for recommendations.

## 2024-02-09 ENCOUNTER — Telehealth (HOSPITAL_BASED_OUTPATIENT_CLINIC_OR_DEPARTMENT_OTHER): Payer: Self-pay

## 2024-02-15 ENCOUNTER — Ambulatory Visit
Admission: RE | Admit: 2024-02-15 | Discharge: 2024-02-15 | Disposition: A | Discharge status: Home / Self Care | Source: Ambulatory Visit | Attending: Obstetrics and Gynecology | Admitting: Obstetrics and Gynecology

## 2024-02-15 DIAGNOSIS — Z01419 Encounter for gynecological examination (general) (routine) without abnormal findings: Secondary | ICD-10-CM

## 2024-02-15 DIAGNOSIS — Z1231 Encounter for screening mammogram for malignant neoplasm of breast: Secondary | ICD-10-CM

## 2024-03-13 ENCOUNTER — Ambulatory Visit: Admitting: "Endocrinology

## 2024-03-13 ENCOUNTER — Encounter: Payer: Self-pay | Admitting: "Endocrinology

## 2024-03-13 DIAGNOSIS — E10649 Type 1 diabetes mellitus with hypoglycemia without coma: Secondary | ICD-10-CM | POA: Diagnosis not present

## 2024-03-13 MED ORDER — INSULIN LISPRO 100 UNIT/ML IJ SOLN: INTRAMUSCULAR | 3 refills | Status: AC

## 2024-03-13 MED ORDER — PEN NEEDLES 32G X 4 MM MISC: 1.0000 | Freq: Four times a day (QID) | 2 refills | Status: AC

## 2024-03-13 MED ORDER — BD INSULIN SYRINGE ULTRAFINE 30G X 1/2" 0.5 ML MISC
3 refills | Status: DC
Start: 2024-03-13 — End: 2024-03-16

## 2024-03-13 MED ORDER — TOUJEO SOLOSTAR 300 UNIT/ML ~~LOC~~ SOPN: 15.0000 [IU] | PEN_INJECTOR | Freq: Once | SUBCUTANEOUS | 3 refills | Status: AC

## 2024-03-13 NOTE — Patient Instructions (Signed)

## 2024-03-13 NOTE — Progress Notes (Signed)
 Outpatient Endocrinology Note Maria Birmingham, MD  03/13/24   Maria White [birth year] [id number]  Referring Provider: Watt Harlene BROCKS, MD Primary Care Provider: Watt Harlene BROCKS, MD Reason for consultation: Subjective   Assessment & Plan  Diagnoses and all orders for this visit:  Type 1 diabetes mellitus with hypoglycemia and without coma (HCC) -     TOUJEO  SOLOSTAR 300 UNIT/ML Solostar Pen; Inject 15 Units into the skin once for 1 dose. -     insulin  lispro (HUMALOG ) 100 UNIT/ML injection; INJECT SUBCUTANEOUSLY UP TO 50  UNITS DAILY BASED ON  INSULIN -CARB RATIO -     Insulin  Syringe-Needle U-100 (B-D INS SYR ULTRAFINE .5CC/30G) 30G X 1/2 0.5 ML MISC; Use as directed to administer insulin  -     Insulin  Pen Needle (PEN NEEDLES) 32G X 4 MM MISC; 1 Device by Does not apply route in the morning, at noon, in the evening, and at bedtime.   Diabetes Type I with no complications Lab Results  Component Value Date   GFR 89.67 02/03/2023   Hba1c goal less than 7, current Hba1c is  Lab Results  Component Value Date   HGBA1C 6.0 (H) 01/20/2024   Will recommend the following: Toujeo  15 units qam  Humalog  IC 1:12, ISF 1:50>150, 15 min before meals (take one unit less than calculated dose if still having hypoglycemia) BG <70 = no humalog   No known contraindications to any of above medications Glucagon  explained and prescribed with refills on 02/10/23  -Last LD and Tg are as follows: Lab Results  Component Value Date   LDLCALC 128 (H) 02/03/2023    Lab Results  Component Value Date   TRIG 55.0 02/03/2023   -Chooses not to be on statin, counseled extensively previously  -Follow low fat diet and exercise   -Blood pressure goal <140/90 - Microalbumin/creatinine goal < 30 -Last MA/Cr is as follows: No results found for: MICROALBUR, [id number]  -on ACE/ARB  -diet changes including salt restriction -limit eating outside -counseled BP targets per standards of  diabetes care -uncontrolled blood pressure can lead to retinopathy, nephropathy and cardiovascular and atherosclerotic heart disease  Reviewed and counseled on: -A1C target -Blood sugar targets -Complications of uncontrolled diabetes  -Checking blood sugar before meals and bedtime and bring log next visit -All medications with mechanism of action and side effects -Hypoglycemia management: rule of 15's, Glucagon  Emergency Kit and medical alert ID -low-carb low-fat plate-method diet -At least 20 minutes of physical activity per day -Annual dilated retinal eye exam and foot exam -compliance and follow up needs -follow up as scheduled or earlier if problem gets worse  Call if blood sugar is less than 70 or consistently above 250    Take a 15 gm snack of carbohydrate at bedtime before you go to sleep if your blood sugar is less than 100.    If you are going to fast after midnight for a test or procedure, ask your physician for instructions on how to reduce/decrease your insulin  dose.    Call if blood sugar is less than 70 or consistently above 250  -Treating a low sugar by rule of 15  (15 gms of sugar every 15 min until sugar is more than 70) If you feel your sugar is low, test your sugar to be sure If your sugar is low (less than 70), then take 15 grams of a fast acting Carbohydrate (3-4 glucose tablets or glucose gel or 4 ounces of juice or regular soda) Recheck your  sugar 15 min after treating low to make sure it is more than 70 If sugar is still less than 70, treat again with 15 grams of carbohydrate          Don't drive the hour of hypoglycemia  If unconscious/unable to eat or drink by mouth, use glucagon  injection or nasal spray baqsimi and call 911. Can repeat again in 15 min if still unconscious.  Return in about 6 weeks (around 04/24/2024) for 9 am double book tele-visit.   I have reviewed current medications, nurse's notes, allergies, vital signs, past medical and surgical  history, family medical history, and social history for this encounter. Counseled patient on symptoms, examination findings, lab findings, imaging results, treatment decisions and monitoring and prognosis. The patient understood the recommendations and agrees with the treatment plan. All questions regarding treatment plan were fully answered.  Maria Birmingham, MD  03/13/24  History of Present Illness Maria White is a 58 y.o. year old female who presents for follow up on of Type I diabetes mellitus.  Maria White was first diagnosed at 58 Diabetes education +  Home diabetes regimen: Toujeo  17 units qam Humalog  IC 1:15, ISF 1:50>150, 15 min before meals   COMPLICATIONS -  MI/Stroke -  retinopathy -  neuropathy -  nephropathy  BLOOD SUGAR DATA CGM interpretation: At today's visit, we reviewed her CGM downloads. The full report is scanned in the media. Reviewing the CGM trends, BG are low across the day, nixed with normal-highs.  Physical Exam  BP 120/80   Pulse 86   Ht 5' 3 (1.6 m)   Wt 125 lb (56.7 kg)   SpO2 98%   BMI 22.14 kg/m    Constitutional: well developed, well nourished Head: normocephalic, atraumatic Eyes: sclera anicteric, no redness Neck: supple Lungs: normal respiratory effort Neurology: alert and oriented Skin: dry, no appreciable rashes Musculoskeletal: no appreciable defects Psychiatric: normal mood and affect Diabetic Foot Exam - Simple   No data filed      Current Medications Patient's Medications  New Prescriptions   INSULIN  PEN NEEDLE (PEN NEEDLES) 32G X 4 MM MISC    1 Device by Does not apply route in the morning, at noon, in the evening, and at bedtime.  Previous Medications   CONTINUOUS GLUCOSE SENSOR (DEXCOM G7 SENSOR) MISC    1 Device by Does not apply route continuous.   GLUCAGON  (GVOKE HYPOPEN  1-PACK) 1 MG/0.2ML SOAJ    Inject 1 mg into the skin as needed (low blood sugar with impaired consciousness).   GLUCOSE BLOOD (ONE TOUCH  ULTRA TEST) TEST STRIP    Use as instructed test 8 to 10 times a day   GLUCOSE BLOOD (ONETOUCH ULTRA) TEST STRIP    Use as instructed   INSULIN  PEN NEEDLE (BD ULTRA-FINE PEN NEEDLES) 29G X 12.7MM MISC    USE 1 PEN NEEDLE IN THE MORNING  AND AT BEDTIME   INSULIN  SYRINGE-NEEDLE U-100 (BD INSULIN  SYRINGE U/F) 30G X 1/2 0.3 ML MISC    USE AS DIRECTED SUBCUTANEOUSLY 5 TIMES DAILY   MULTIPLE VITAMINS-MINERALS (MULTIVITAMIN ADULT PO)    Take 1 tablet by mouth daily.   PRASTERONE  (INTRAROSA ) 6.5 MG INST    Place 1 suppository vaginally at bedtime as needed.   PROGESTERONE  (PROMETRIUM ) 100 MG CAPSULE    Take 1 capsule (100 mg total) by mouth at bedtime. May increase to 200mg  at bedtime with persistent insomnia and hot flashes  Modified Medications   Modified Medication Previous Medication  INSULIN  LISPRO (HUMALOG ) 100 UNIT/ML INJECTION insulin  lispro (HUMALOG ) 100 UNIT/ML injection      INJECT SUBCUTANEOUSLY UP TO 50  UNITS DAILY BASED ON  INSULIN -CARB RATIO    INJECT SUBCUTANEOUSLY UP TO 50  UNITS DAILY BASED ON  INSULIN -CARB RATIO   INSULIN  SYRINGE-NEEDLE U-100 (B-D INS SYR ULTRAFINE .5CC/30G) 30G X 1/2 0.5 ML MISC Insulin  Syringe-Needle U-100 (B-D INS SYR ULTRAFINE .5CC/30G) 30G X 1/2 0.5 ML MISC      Use as directed to administer insulin     Use as directed to administer insulin    TOUJEO  SOLOSTAR 300 UNIT/ML SOLOSTAR PEN TOUJEO  SOLOSTAR 300 UNIT/ML Solostar Pen      Inject 15 Units into the skin once for 1 dose.    Inject 18 Units into the skin once for 1 dose.  Discontinued Medications   No medications on file    Allergies No Known Allergies  Past Medical History Past Medical History:  Diagnosis Date   Anemia    resolved   Diabetes mellitus    age 28   Elevated prolactin level    Pituitary adenoma (HCC) 9/11   5 mm    Past Surgical History Past Surgical History:  Procedure Laterality Date   COLPOSCOPY     ENDOMETRIAL ABLATION  8/11   her option   LASER ABLATION OF THE CERVIX       Family History family history includes Atrial fibrillation in her mother; Breast cancer in an other family member; Breast cancer (age of onset: 37) in her maternal grandmother; Breast cancer (age of onset: 41) in her mother; Hypertension in her mother.  Social History Social History   Socioeconomic History   Marital status: Married    Spouse name: Not on file   Number of children: Not on file   Years of education: Not on file   Highest education level: Not on file  Occupational History   Not on file  Tobacco Use   Smoking status: Never   Smokeless tobacco: Never  Vaping Use   Vaping status: Never Used  Substance and Sexual Activity   Alcohol use: No   Drug use: No   Sexual activity: Yes    Partners: Male    Birth control/protection: Other-see comments    Comment: Vasectomy  Other Topics Concern   Not on file  Social History Narrative   Not on file   Social Drivers of Health   Financial Resource Strain: Not on file  Food Insecurity: Not on file  Transportation Needs: Not on file  Physical Activity: Not on file  Stress: Not on file  Social Connections: Unknown (12/09/2021)   Received [facility]   Social Network    Social Network: Not on file  Intimate Partner Violence: Unknown (10/31/2021)   Received from [facility]   HITS    Physically Hurt: Not on file    Insult or Talk Down To: Not on file    Threaten Physical Harm: Not on file    Scream or Curse: Not on file    Lab Results  Component Value Date   HGBA1C 6.0 (H) 01/20/2024   HGBA1C 5.8 (A) 11/03/2023   HGBA1C 5.5 06/03/2023   Lab Results  Component Value Date   CHOL 214 (H) 02/03/2023   Lab Results  Component Value Date   HDL 74.70 02/03/2023   Lab Results  Component Value Date   LDLCALC 128 (H) 02/03/2023   Lab Results  Component Value Date   TRIG 55.0 02/03/2023  Lab Results  Component Value Date   CHOLHDL 3 02/03/2023   Lab Results  Component Value Date   CREATININE  0.74 02/03/2023   Lab Results  Component Value Date   GFR 89.67 02/03/2023   No results found for: MACKEY CURRENT     Component Value Date/Time   NA 141 02/03/2023 0933   NA 144 07/10/2020 0000   K 4.0 02/03/2023 0933   CL 101 02/03/2023 0933   CO2 32 02/03/2023 0933   GLUCOSE 55 (L) 02/03/2023 0933   BUN 14 02/03/2023 0933   BUN 15 07/10/2020 0000   CREATININE 0.74 02/03/2023 0933   CALCIUM 9.7 02/03/2023 0933   PROT 6.5 02/03/2023 0933   ALBUMIN 4.4 02/03/2023 0933   AST 14 02/03/2023 0933   ALT 12 02/03/2023 0933   ALKPHOS 56 02/03/2023 0933   BILITOT 0.5 02/03/2023 0933   GFRNONAA 92.23 01/17/2020 0000   GFRAA  03/12/2010 0900    >60        The eGFR has been calculated using the MDRD equation. This calculation has not been validated in all clinical situations. eGFR's persistently <60 mL/min signify possible Chronic Kidney Disease.      Latest Ref Rng & Units 02/03/2023    9:33 AM 07/15/2022    9:47 AM 03/26/2021    3:01 PM  BMP  Glucose 70 - 99 mg/dL 55  66  824   BUN 6 - 23 mg/dL 14  12  18    Creatinine 0.40 - 1.20 mg/dL 9.25  9.23  9.24   Sodium 135 - 145 mEq/L 141  139  136   Potassium 3.5 - 5.1 mEq/L 4.0  4.0  4.0   Chloride 96 - 112 mEq/L 101  102  99   CO2 19 - 32 mEq/L 32  32  30   Calcium 8.4 - 10.5 mg/dL 9.7  9.2  8.8        Component Value Date/Time   WBC 5.0 07/15/2022 0947   RBC 4.62 07/15/2022 0947   HGB 13.7 07/15/2022 0947   HCT 40.7 07/15/2022 0947   PLT 254.0 07/15/2022 0947   MCV 88.1 07/15/2022 0947   MCHC 33.7 07/15/2022 0947   RDW 13.2 07/15/2022 0947   LYMPHSABS 1.8 04/02/2011 1613   MONOABS 0.5 04/02/2011 1613   EOSABS 0.1 04/02/2011 1613   BASOSABS 0.0 04/02/2011 1613     Parts of this note may have been dictated using voice recognition software. There may be variances in spelling and vocabulary which are unintentional. Not all errors are proofread. Please notify the dino if any discrepancies are noted or if  the meaning of any statement is not clear.

## 2024-03-15 ENCOUNTER — Telehealth: Payer: Self-pay | Admitting: "Endocrinology

## 2024-03-15 NOTE — Telephone Encounter (Signed)
 Patient called to advise that she received pen needles from her mail in pharmacy.  She advises that she does not use pen needles.  Patient wanted to speak with someone about why these were sent to the pharmacy.  She advises has now paid for something she can not use nor return. Transferred to medical assistant -   call back # [phone]

## 2024-03-16 ENCOUNTER — Other Ambulatory Visit: Payer: Self-pay

## 2024-03-16 DIAGNOSIS — E10649 Type 1 diabetes mellitus with hypoglycemia without coma: Secondary | ICD-10-CM

## 2024-03-16 MED ORDER — INSULIN PEN NEEDLE 29G X 12.7MM MISC: 1.0000 | Freq: Two times a day (BID) | 1 refills | Status: DC

## 2024-03-16 NOTE — Telephone Encounter (Signed)
 Lvm for pt to call back.

## 2024-03-17 ENCOUNTER — Other Ambulatory Visit: Payer: Self-pay

## 2024-03-17 DIAGNOSIS — E10649 Type 1 diabetes mellitus with hypoglycemia without coma: Secondary | ICD-10-CM

## 2024-03-17 MED ORDER — INSULIN PEN NEEDLE 29G X 12.7MM MISC
1.0000 | Freq: Two times a day (BID) | 1 refills | Status: AC
Start: 2024-03-17 — End: ?

## 2024-03-29 ENCOUNTER — Telehealth (HOSPITAL_BASED_OUTPATIENT_CLINIC_OR_DEPARTMENT_OTHER): Payer: Self-pay

## 2024-04-24 ENCOUNTER — Telehealth: Admitting: "Endocrinology

## 2024-07-22 ENCOUNTER — Other Ambulatory Visit: Payer: Self-pay | Admitting: "Endocrinology

## 2024-08-17 ENCOUNTER — Ambulatory Visit: Payer: Self-pay

## 2024-08-17 NOTE — Telephone Encounter (Signed)
 "    Reason for Triage: Pt is nauseous and has been throwing up. Pt stated that her blood sugar has been low all day. no appetitie. Readings: 50 and goes above 60 and shoots back down.   *Wants rx called to [facility] on [address]  **Requsting One touch Ultra Test Strips, Glucose gun, and something for nausea.  Reason for Disposition  [1] Low blood sugar symptoms persist > 30 minutes AND [2] using low blood sugar Care Advice  Answer Assessment - Initial Assessment Questions Pt states that since about 11am she has not been able to keep her blood sugar above 60. She said her dexcom reader was saying 50's so she was checking it with a meter but her test strips are expired so not sure if it will be accurate. She did test while on phone with nurse and it was 56. She states has vomited 2 times, one right after the other around 3pm, she is very nauseated still.  She states she did nothing different than she does every day. She states normally her sugar is high or normal in the morning, so she takes her short acting insulin  takes a shower and then eats breakfast, states she has been doing that every day for a very long time. Has been dealing with diabetes for about 20 years now. She states she ate her normal breakfast, vial wasn't a new vial of insulin . Around 11 she was notified it was low. Felt weird around lunch like her stomach didn't feel right. She states she has been doing all kinds of things to get her sugar up today and it just won't go up. She has had toast, carbs, juice, mints, candy. She states she was around her dtr around christmas who had the flu but typically when she gets sick her BS goes the other way so she can't figure out why it's going low. Rn advised given that her BS has been between 50-60 for last 5-6 hours, recommendations are to go to the Er to help figure out why and try to help her regulate back to normal. Pt stated understanding but said she's not ready to pull that trigger yet.  She states the reason she is calling is because she needs new One touch Ultra strips called in, a glucagon  kit - she has one but would like another with the storm coming) and zofran so she can eat as eating is life or death for her. RN agreed but advised pt at this point this message won't be seen until tomorrow and recommendations are for the ER, again explained why and recommended a mask for protection as that was  a concern. Pt stated understanding and stated her husband has gotten really good at taking care of things since they've been dealing with this for 20 years. She states she's not opposed to go to the ER but that she isn't ready to jump to that yet. RN stated understanding.  Due to ED dispo, not all questions anwered.   1. SYMPTOMS: What symptoms are you concerned about?     Nausea, vomiting 2. ONSET:  When did the symptoms start?     About 11 am today 3. BLOOD GLUCOSE: What is your blood glucose level?      56 4. USUAL RANGE: What is your blood glucose level usually? (e.g., usual fasting morning value, usual evening value)      5. TYPE 1 or 2:  Do you know what type of diabetes you have?  (  e.g., Type 1, Type 2, Gestational; doesn't know)      Type 1 6. INSULIN : Do you take insulin ? What type of insulin (s) do you use? What is the mode of delivery? (syringe, pen; injection or pump) When did you last give yourself an insulin  dose? (i.e., time or hours/minutes ago) How much did you give? (i.e., how many units)     Toujeo - 18 units daily, sliding scale 7. DIABETES PILLS: Do you take any pills for your diabetes? If Yes, ask: What is the name of the medicine(s) that you take for high blood sugar?      8. OTHER SYMPTOMS: Do you have any symptoms? (e.g., fever, frequent urination, difficulty breathing, vomiting)      9. LOW BLOOD GLUCOSE TREATMENT: What have you done so far to treat the low blood glucose level?     Juice, toast, candy, mints 10. FOOD: When did you  last eat or drink?        11. ALONE: Are you alone right now or is someone with you?        Husband is with her  Protocols used: Diabetes - Low Blood Sugar-A-AH  "

## 2024-08-17 NOTE — Telephone Encounter (Signed)
 FYI Only or Action Required?: FYI only for provider: ED advised.  Patient was last seen in primary care on 10/20/2023 by Copland, Harlene BROCKS, MD.  Called Nurse Triage reporting Blood Sugar Problem.  Symptoms began today.  Interventions attempted: Other: juice, toast, candy, mints,.  Symptoms are: unchanged.  Triage Disposition: Go to ED Now (Notify PCP)  Patient/caregiver understands and will follow disposition?: No

## 2024-08-18 ENCOUNTER — Telehealth: Admitting: "Endocrinology

## 2024-08-18 ENCOUNTER — Encounter: Payer: Self-pay | Admitting: "Endocrinology

## 2024-08-18 VITALS — Ht 63.0 in | Wt 125.0 lb

## 2024-08-18 DIAGNOSIS — E7800 Pure hypercholesterolemia, unspecified: Secondary | ICD-10-CM

## 2024-08-18 DIAGNOSIS — E10649 Type 1 diabetes mellitus with hypoglycemia without coma: Secondary | ICD-10-CM | POA: Diagnosis not present

## 2024-08-18 NOTE — Patient Instructions (Addendum)
 Will recommend the following: Toujeo  16 units qam (or 8 units bid if sick not feeling well) Humalog  IC 1:15 for break fast and dinner, ISF 1:50>150, 15 min before meals (lunch carb ratio 1:20-1:18) BG <70 = no humalog    Cut down short acting insulin  to half if eating only half a meal or if blood sugar is between 71-100 before a meal Skip short acting insulin  if blood sugar is less than 70 and treat with 15 gms of carbohydrates every 15 min until blood sugar is more than 100     Take a 15 gm snack of carbohydrate at bedtime before you go to sleep if your blood sugar is less than 100.  If you are going to fast after midnight for a test or procedure, ask your physician for instructions on how to reduce/decrease your insulin  dose.  Call if blood sugar is less than 70 or consistently above 250  -Treating a low sugar by rule of 15  (15 gms of sugar every 15 min until sugar is more than 70) If you feel your sugar is low, test your sugar to be sure If your sugar is low (less than 70), then take 15 grams of a fast acting Carbohydrate (3-4 glucose tablets or glucose gel or 4 ounces of juice or regular soda) Recheck your sugar 15 min after treating low to make sure it is more than 70 If sugar is still less than 70, treat again with 15 grams of carbohydrate                Don't drive the hour of hypoglycemia  If unconscious/unable to eat or drink by mouth, use glucagon  injection or nasal spray baqsimi and call 911. Can repeat again in 15 min if still unconscious.  Call your doctor if blood sugar is less than 70 or consistently above 250  ______________   Goals of DM therapy:  Morning Fasting blood sugar: 80-140  Blood sugar before meals: 80-140 Bed time blood sugar: 100-150  A1C <7%, limited only by hypoglycemia  1.Diabetes medications and their side effects discussed, including hypoglycemia    2. Check blood glucose:  a) Always check blood sugars before driving. Please see below (under  hypoglycemia) on how to manage b) Check a minimum of 3 times/day or more as needed when having symptoms of hypoglycemia.   c) Try to check blood glucose before sleeping/in the middle of the night to ensure that it is remaining stable and not dropping less than 100 d) Check blood glucose more often if sick  3. Diet: a) 3 meals per day schedule b: Restrict carbs to 60-70 grams (4 servings) per meal c) Colorful vegetables - 3 servings a day, and low sugar fruit 2 servings/day Plate control method: 1/4 plate protein, 1/4 starch, 1/2 green, yellow, or red vegetables d) Avoid carbohydrate snacks unless hypoglycemic episode, or increased physical activity  4. Regular exercise as tolerated, preferably 3 or more hours a week  5. Hypoglycemia: a)  Do not drive or operate machinery without first testing blood glucose to assure it is over 90 mg%, or if dizzy, lightheaded, not feeling normal, etc, or  if foot or leg is numb or weak. b)  If blood glucose less than 70, take four 5gm Glucose tabs or 15-30 gm Glucose gel.  Repeat every 15 min as needed until blood sugar is >100 mg/dl. If hypoglycemia persists then call 911.   6. Sick day management: a) Check blood glucose more often b)  Continue usual therapy if blood sugars are elevated.   7. Contact the doctor immediately if blood glucose is frequently <60 mg/dl, or an episode of severe hypoglycemia occurs (where someone had to give you glucose/  glucagon  or if you passed out from a low blood glucose), or if blood glucose is persistently >350 mg/dl, for further management  8. A change in level of physical activity or exercise and a change in diet may also affect your blood sugar. Check blood sugars more often and call if needed.  Instructions: 1. Bring glucose meter, blood glucose records on every visit for review 2. Continue to follow up with primary care physician and other providers for medical care 3. Yearly eye  and foot exam 4. Please get  blood work done prior to the next appointment

## 2024-08-18 NOTE — Progress Notes (Signed)
 The patient reports they are currently: [name]. I spent 12-13 minutes on the video with the patient on the date of service. I spent an additional 5 minutes on pre- and post-visit activities on the date of service.   The patient was physically located in [place]  or a state in which I am permitted to provide care. The patient and/or parent/guardian understood that s/he may incur co-pays and cost sharing, and agreed to the telemedicine visit. The visit was reasonable and appropriate under the circumstances given the patient's presentation at the time.  The patient and/or parent/guardian understands the potential risks and limitations of this mode of treatment (including, but not limited to, the absence of in-person examination) and has agreed to be treated using telemedicine. The patient's/patient's family's questions regarding telemedicine have been answered.   The patient and/or parent/guardian will contact their provider's office for worsening conditions, and seek emergency medical treatment and/or call 911 if the patient deems either necessary.        Outpatient Endocrinology Note Maria Birmingham, MD  08/18/24   Maria White 08-Oct-1965 [id number]  Referring Provider: Watt Harlene BROCKS, MD Primary Care Provider: Watt Harlene BROCKS, MD Reason for consultation: Subjective   Assessment & Plan  Maria White was seen today for diabetes.  Diagnoses and all orders for this visit:  Type 1 diabetes mellitus with hypoglycemia and without coma (HCC) -     Lipid panel -     Hemoglobin A1c -     Microalbumin / creatinine urine ratio -     Comprehensive metabolic panel with GFR  Pure hypercholesterolemia    Diabetes Type I with no complications Lab Results  Component Value Date   GFR 89.67 02/03/2023   Hba1c goal less than 7, current Hba1c is  Lab Results  Component Value Date   HGBA1C 6.0 (H) 01/20/2024   Will recommend the following: Toujeo  16 units qam (or 8 units  bid) Humalog  IC 1:15 for break fast and dinner, ISF 1:50>150, 15 min before meals (lunch carb ratio 1:20-1:18) BG <70 = no humalog   Cut down short acting insulin  to half if eating only half a meal or if blood sugar is between 71-100 before a meal Skip short acting insulin  if blood sugar is less than 70 and treat with 15 gms of carbohydrates every 15 min until blood sugar is more than 100   No known contraindications to any of above medications Glucagon  explained and prescribed with refills on 02/10/23  -Last LD and Tg are as follows: Lab Results  Component Value Date   LDLCALC 128 (H) 02/03/2023    Lab Results  Component Value Date   TRIG 55.0 02/03/2023   -Chooses not to be on statin, counseled extensively previously  -Follow low fat diet and exercise   -Blood pressure goal <140/90 - Microalbumin/creatinine goal < 30 -Last MA/Cr is as follows: No results found for: MICROALBUR, [id number]  -on ACE/ARB  -diet changes including salt restriction -limit eating outside -counseled BP targets per standards of diabetes care -uncontrolled blood pressure can lead to retinopathy, nephropathy and cardiovascular and atherosclerotic heart disease  Reviewed and counseled on: -A1C target -Blood sugar targets -Complications of uncontrolled diabetes  -Checking blood sugar before meals and bedtime and bring log next visit -All medications with mechanism of action and side effects -Hypoglycemia management: rule of 15's, Glucagon  Emergency Kit and medical alert ID -low-carb low-fat plate-method diet -At least 20 minutes of physical activity per day -Annual dilated retinal eye exam and foot  exam -compliance and follow up needs -follow up as scheduled or earlier if problem gets worse  Call if blood sugar is less than 70 or consistently above 250    Take a 15 gm snack of carbohydrate at bedtime before you go to sleep if your blood sugar is less than 100.    If you are going to fast after  midnight for a test or procedure, ask your physician for instructions on how to reduce/decrease your insulin  dose.    Call if blood sugar is less than 70 or consistently above 250  -Treating a low sugar by rule of 15  (15 gms of sugar every 15 min until sugar is more than 70) If you feel your sugar is low, test your sugar to be sure If your sugar is low (less than 70), then take 15 grams of a fast acting Carbohydrate (3-4 glucose tablets or glucose gel or 4 ounces of juice or regular soda) Recheck your sugar 15 min after treating low to make sure it is more than 70 If sugar is still less than 70, treat again with 15 grams of carbohydrate          Don't drive the hour of hypoglycemia  If unconscious/unable to eat or drink by mouth, use glucagon  injection or nasal spray baqsimi and call 911. Can repeat again in 15 min if still unconscious.  Return in about 1 month (around 09/18/2024) for visit and 8 am labs before next visit.   I have reviewed current medications, nurse's notes, allergies, vital signs, past medical and surgical history, family medical history, and social history for this encounter. Counseled patient on symptoms, examination findings, lab findings, imaging results, treatment decisions and monitoring and prognosis. The patient understood the recommendations and agrees with the treatment plan. All questions regarding treatment plan were fully answered.  Maria Birmingham, MD  08/18/24  History of Present Illness Maria White is a 59 y.o. year old female who presents for follow up on of Type I diabetes mellitus.  Maria White was first diagnosed at 59. Diabetes education +  Home diabetes regimen: Toujeo  18 units qam Lispro IC 1:15, ISF 1:50>150, 15 min before meals   COMPLICATIONS -  MI/Stroke -  retinopathy -  neuropathy -  nephropathy  BLOOD SUGAR DATA CGM interpretation: At today's visit, we reviewed her CGM downloads. The full report is scanned in the media.  Reviewing the CGM trends, BG are low 5am-8pm, and again between 1am-3am.   Physical Exam  Ht 5' 3 (1.6 m)   Wt 125 lb (56.7 kg)   BMI 22.14 kg/m    Constitutional: well developed, well nourished Head: normocephalic, atraumatic Eyes: sclera anicteric, no redness Neck: supple Lungs: normal respiratory effort Neurology: alert and oriented Skin: dry, no appreciable rashes Musculoskeletal: no appreciable defects Psychiatric: normal mood and affect Diabetic Foot Exam - Simple   No data filed      Current Medications Patient's Medications  New Prescriptions   No medications on file  Previous Medications   CONTINUOUS GLUCOSE SENSOR (DEXCOM G7 SENSOR) MISC    1 Device by Does not apply route continuous.   GLUCAGON  (GVOKE HYPOPEN  1-PACK) 1 MG/0.2ML SOAJ    Inject 1 mg into the skin as needed (low blood sugar with impaired consciousness).   GLUCOSE BLOOD (ONE TOUCH ULTRA TEST) TEST STRIP    Use as instructed test 8 to 10 times a day   GLUCOSE BLOOD (ONETOUCH ULTRA) TEST STRIP  Use as instructed   INSULIN  LISPRO (HUMALOG ) 100 UNIT/ML INJECTION    INJECT SUBCUTANEOUSLY UP TO 50  UNITS DAILY BASED ON  INSULIN -CARB RATIO   INSULIN  PEN NEEDLE (BD ULTRA-FINE PEN NEEDLES) 29G X 12.7MM MISC    USE 1 PEN NEEDLE IN THE MORNING  AND AT BEDTIME   INSULIN  PEN NEEDLE (PEN NEEDLES) 32G X 4 MM MISC    1 Device by Does not apply route in the morning, at noon, in the evening, and at bedtime.   INSULIN  PEN NEEDLE 29G X 12.7MM MISC    1 Needle by Does not apply route 2 (two) times daily. USE 1 PEN NEEDLE IN THE MORNING AND AT BEDTIME.   INSULIN  SYRINGE-NEEDLE U-100 (EMBECTA INSULIN  SYR ULTRAFINE) 30G X 1/2 0.3 ML MISC    USE 5 TIMES PER DAY AS DIRECTED  TO ADMINISTER INSULIN    MULTIPLE VITAMINS-MINERALS (MULTIVITAMIN ADULT PO)    Take 1 tablet by mouth daily.   PRASTERONE  (INTRAROSA ) 6.5 MG INST    Place 1 suppository vaginally at bedtime as needed.   PROGESTERONE  (PROMETRIUM ) 100 MG CAPSULE    Take 1  capsule (100 mg total) by mouth at bedtime. May increase to 200mg  at bedtime with persistent insomnia and hot flashes   TOUJEO  SOLOSTAR 300 UNIT/ML SOLOSTAR PEN    Inject 15 Units into the skin once for 1 dose.  Modified Medications   No medications on file  Discontinued Medications   No medications on file    Allergies No Known Allergies  Past Medical History Past Medical History:  Diagnosis Date   Anemia    resolved   Diabetes mellitus    age 36   Elevated prolactin level    Pituitary adenoma (HCC) 9/11   5 mm    Past Surgical History Past Surgical History:  Procedure Laterality Date   COLPOSCOPY     ENDOMETRIAL ABLATION  8/11   her option   LASER ABLATION OF THE CERVIX      Family History family history includes Atrial fibrillation in her mother; Breast cancer in an other family member; Breast cancer (age of onset: 43) in her maternal grandmother; Breast cancer (age of onset: 49) in her mother; Hypertension in her mother.  Social History Social History   Socioeconomic History   Marital status: Married    Spouse name: Not on file   Number of children: Not on file   Years of education: Not on file   Highest education level: Not on file  Occupational History   Not on file  Tobacco Use   Smoking status: Never   Smokeless tobacco: Never  Vaping Use   Vaping status: Never Used  Substance and Sexual Activity   Alcohol use: No   Drug use: No   Sexual activity: Yes    Partners: Male    Birth control/protection: Other-see comments    Comment: Vasectomy  Other Topics Concern   Not on file  Social History Narrative   Not on file   Social Drivers of Health   Tobacco Use: Low Risk (08/18/2024)   Patient History    Smoking Tobacco Use: Never    Smokeless Tobacco Use: Never    Passive Exposure: Not on file  Financial Resource Strain: Not on file  Food Insecurity: Not on file  Transportation Needs: Not on file  Physical Activity: Not on file  Stress: Not on  file  Social Connections: Not on file  Intimate Partner Violence: Not on file  Depression (  PHQ2-9): Low Risk (01/20/2024)   Depression (PHQ2-9)    PHQ-2 Score: 0  Alcohol Screen: Not on file  Housing: Not on file  Utilities: Not on file  Health Literacy: Not on file    Lab Results  Component Value Date   HGBA1C 6.0 (H) 01/20/2024   HGBA1C 5.8 (A) 11/03/2023   HGBA1C 5.5 06/03/2023   Lab Results  Component Value Date   CHOL 214 (H) 02/03/2023   Lab Results  Component Value Date   HDL 74.70 02/03/2023   Lab Results  Component Value Date   LDLCALC 128 (H) 02/03/2023   Lab Results  Component Value Date   TRIG 55.0 02/03/2023   Lab Results  Component Value Date   CHOLHDL 3 02/03/2023   Lab Results  Component Value Date   CREATININE 0.74 02/03/2023   Lab Results  Component Value Date   GFR 89.67 02/03/2023   No results found for: MACKEY CURRENT     Component Value Date/Time   NA 141 02/03/2023 0933   NA 144 07/10/2020 0000   K 4.0 02/03/2023 0933   CL 101 02/03/2023 0933   CO2 32 02/03/2023 0933   GLUCOSE 55 (L) 02/03/2023 0933   BUN 14 02/03/2023 0933   BUN 15 07/10/2020 0000   CREATININE 0.74 02/03/2023 0933   CALCIUM 9.7 02/03/2023 0933   PROT 6.5 02/03/2023 0933   ALBUMIN 4.4 02/03/2023 0933   AST 14 02/03/2023 0933   ALT 12 02/03/2023 0933   ALKPHOS 56 02/03/2023 0933   BILITOT 0.5 02/03/2023 0933   GFRNONAA 92.23 01/17/2020 0000   GFRAA  03/12/2010 0900    >60        The eGFR has been calculated using the MDRD equation. This calculation has not been validated in all clinical situations. eGFR's persistently <60 mL/min signify possible Chronic Kidney Disease.      Latest Ref Rng & Units 02/03/2023    9:33 AM 07/15/2022    9:47 AM 03/26/2021    3:01 PM  BMP  Glucose 70 - 99 mg/dL 55  66  824   BUN 6 - 23 mg/dL 14  12  18    Creatinine 0.40 - 1.20 mg/dL 9.25  9.23  9.24   Sodium 135 - 145 mEq/L 141  139  136   Potassium 3.5 -  5.1 mEq/L 4.0  4.0  4.0   Chloride 96 - 112 mEq/L 101  102  99   CO2 19 - 32 mEq/L 32  32  30   Calcium 8.4 - 10.5 mg/dL 9.7  9.2  8.8        Component Value Date/Time   WBC 5.0 07/15/2022 0947   RBC 4.62 07/15/2022 0947   HGB 13.7 07/15/2022 0947   HCT 40.7 07/15/2022 0947   PLT 254.0 07/15/2022 0947   MCV 88.1 07/15/2022 0947   MCHC 33.7 07/15/2022 0947   RDW 13.2 07/15/2022 0947   LYMPHSABS 1.8 04/02/2011 1613   MONOABS 0.5 04/02/2011 1613   EOSABS 0.1 04/02/2011 1613   BASOSABS 0.0 04/02/2011 1613     Parts of this note may have been dictated using voice recognition software. There may be variances in spelling and vocabulary which are unintentional. Not all errors are proofread. Please notify the dino if any discrepancies are noted or if the meaning of any statement is not clear.
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
